# Patient Record
Sex: Male | Born: 1940 | Race: White | Hispanic: No | Smoking: Current every day smoker
Health system: Southern US, Community
[De-identification: ages and names within clinical notes are randomized; demographics above are authoritative.]

## PROBLEM LIST (undated history)

## (undated) DIAGNOSIS — IMO0001 Reserved for inherently not codable concepts without codable children: Secondary | ICD-10-CM

## (undated) DIAGNOSIS — F17200 Nicotine dependence, unspecified, uncomplicated: Secondary | ICD-10-CM

## (undated) DIAGNOSIS — Z905 Acquired absence of kidney: Secondary | ICD-10-CM

## (undated) DIAGNOSIS — C689 Malignant neoplasm of urinary organ, unspecified: Secondary | ICD-10-CM

## (undated) DIAGNOSIS — Z906 Acquired absence of other parts of urinary tract: Secondary | ICD-10-CM

## (undated) DIAGNOSIS — D649 Anemia, unspecified: Secondary | ICD-10-CM

## (undated) DIAGNOSIS — C649 Malignant neoplasm of unspecified kidney, except renal pelvis: Secondary | ICD-10-CM

## (undated) DIAGNOSIS — F419 Anxiety disorder, unspecified: Secondary | ICD-10-CM

## (undated) DIAGNOSIS — F329 Major depressive disorder, single episode, unspecified: Secondary | ICD-10-CM

## (undated) DIAGNOSIS — K469 Unspecified abdominal hernia without obstruction or gangrene: Secondary | ICD-10-CM

## (undated) DIAGNOSIS — F32A Depression, unspecified: Secondary | ICD-10-CM

## (undated) DIAGNOSIS — C679 Malignant neoplasm of bladder, unspecified: Secondary | ICD-10-CM

## (undated) DIAGNOSIS — T7840XA Allergy, unspecified, initial encounter: Secondary | ICD-10-CM

## (undated) DIAGNOSIS — I10 Essential (primary) hypertension: Secondary | ICD-10-CM

## (undated) DIAGNOSIS — R030 Elevated blood-pressure reading, without diagnosis of hypertension: Secondary | ICD-10-CM

## (undated) DIAGNOSIS — Z1211 Encounter for screening for malignant neoplasm of colon: Secondary | ICD-10-CM

## (undated) DIAGNOSIS — Z8551 Personal history of malignant neoplasm of bladder: Secondary | ICD-10-CM

## (undated) HISTORY — DX: Elevated blood-pressure reading, without diagnosis of hypertension: R03.0

## (undated) HISTORY — DX: Depression, unspecified: F32.A

## (undated) HISTORY — PX: HERNIA REPAIR: SHX51

## (undated) HISTORY — DX: Malignant neoplasm of bladder, unspecified: C67.9

## (undated) HISTORY — DX: Acquired absence of other parts of urinary tract: Z90.6

## (undated) HISTORY — DX: Essential (primary) hypertension: I10

## (undated) HISTORY — DX: Nicotine dependence, unspecified, uncomplicated: F17.200

## (undated) HISTORY — DX: Allergy, unspecified, initial encounter: T78.40XA

## (undated) HISTORY — PX: STOMACH SURGERY: SHX791

## (undated) HISTORY — PX: BLADDER SURGERY: SHX569

## (undated) HISTORY — DX: Malignant neoplasm of urinary organ, unspecified: C68.9

## (undated) HISTORY — PX: LITHOTRIPSY: SUR834

## (undated) HISTORY — DX: Acquired absence of kidney: Z90.5

## (undated) HISTORY — DX: Anxiety disorder, unspecified: F41.9

## (undated) HISTORY — DX: Reserved for inherently not codable concepts without codable children: IMO0001

## (undated) HISTORY — DX: Major depressive disorder, single episode, unspecified: F32.9

## (undated) HISTORY — DX: Malignant neoplasm of unspecified kidney, except renal pelvis: C64.9

## (undated) HISTORY — DX: Anemia, unspecified: D64.9

## (undated) HISTORY — DX: Unspecified abdominal hernia without obstruction or gangrene: K46.9

---

## 2003-06-05 ENCOUNTER — Ambulatory Visit (HOSPITAL_COMMUNITY): Admission: RE | Admit: 2003-06-05 | Discharge: 2003-06-05 | Payer: Self-pay | Admitting: Urology

## 2006-03-09 ENCOUNTER — Ambulatory Visit (HOSPITAL_COMMUNITY): Admission: RE | Admit: 2006-03-09 | Discharge: 2006-03-09 | Payer: Self-pay | Admitting: *Deleted

## 2006-03-09 ENCOUNTER — Encounter (INDEPENDENT_AMBULATORY_CARE_PROVIDER_SITE_OTHER): Payer: Self-pay | Admitting: Specialist

## 2006-03-09 LAB — HM COLONOSCOPY

## 2009-06-20 HISTORY — PX: OTHER SURGICAL HISTORY: SHX169

## 2009-11-11 ENCOUNTER — Encounter (INDEPENDENT_AMBULATORY_CARE_PROVIDER_SITE_OTHER): Payer: Self-pay | Admitting: Urology

## 2009-11-11 ENCOUNTER — Ambulatory Visit (HOSPITAL_COMMUNITY): Admission: RE | Admit: 2009-11-11 | Discharge: 2009-11-12 | Payer: Self-pay | Admitting: Urology

## 2010-01-06 ENCOUNTER — Encounter: Payer: Self-pay | Admitting: Urology

## 2010-01-06 ENCOUNTER — Inpatient Hospital Stay (HOSPITAL_COMMUNITY)
Admission: RE | Admit: 2010-01-06 | Discharge: 2010-01-08 | Payer: Self-pay | Source: Home / Self Care | Admitting: Urology

## 2010-03-30 ENCOUNTER — Ambulatory Visit (HOSPITAL_COMMUNITY)
Admission: RE | Admit: 2010-03-30 | Discharge: 2010-03-30 | Payer: Self-pay | Source: Home / Self Care | Admitting: Urology

## 2010-07-01 ENCOUNTER — Observation Stay (HOSPITAL_COMMUNITY)
Admission: EM | Admit: 2010-07-01 | Discharge: 2010-07-02 | Payer: Self-pay | Source: Home / Self Care | Attending: Internal Medicine | Admitting: Internal Medicine

## 2010-07-05 LAB — LIPID PANEL
Cholesterol: 140 mg/dL (ref 0–200)
HDL: 30 mg/dL — ABNORMAL LOW (ref >39)
LDL Cholesterol: 96 mg/dL (ref 0–99)
Total CHOL/HDL Ratio: 4.7 RATIO
Triglycerides: 71 mg/dL (ref <150)
VLDL: 14 mg/dL (ref 0–40)

## 2010-07-05 LAB — POCT I-STAT, CHEM 8
BUN: 22 mg/dL (ref 6–23)
Calcium, Ion: 1.15 mmol/L (ref 1.12–1.32)
Chloride: 109 mEq/L (ref 96–112)
Creatinine, Ser: 1.8 mg/dL — ABNORMAL HIGH (ref 0.4–1.5)
Glucose, Bld: 89 mg/dL (ref 70–99)
HCT: 43 % (ref 39.0–52.0)
Hemoglobin: 14.6 g/dL (ref 13.0–17.0)
Potassium: 3.9 mEq/L (ref 3.5–5.1)
Sodium: 141 mEq/L (ref 135–145)
TCO2: 23 mmol/L (ref 0–100)

## 2010-07-05 LAB — COMPREHENSIVE METABOLIC PANEL
ALT: 11 U/L (ref 0–53)
ALT: 10 U/L (ref 0–53)
AST: 18 U/L (ref 0–37)
AST: 16 U/L (ref 0–37)
Albumin: 3.9 g/dL (ref 3.5–5.2)
Albumin: 3.1 g/dL — ABNORMAL LOW (ref 3.5–5.2)
Alkaline Phosphatase: 71 U/L (ref 39–117)
Alkaline Phosphatase: 58 U/L (ref 39–117)
BUN: 19 mg/dL (ref 6–23)
BUN: 19 mg/dL (ref 6–23)
CO2: 24 mEq/L (ref 19–32)
CO2: 24 mEq/L (ref 19–32)
Calcium: 9.4 mg/dL (ref 8.4–10.5)
Calcium: 8.6 mg/dL (ref 8.4–10.5)
Chloride: 108 mEq/L (ref 96–112)
Chloride: 110 mEq/L (ref 96–112)
Creatinine, Ser: 1.69 mg/dL — ABNORMAL HIGH (ref 0.4–1.5)
Creatinine, Ser: 1.57 mg/dL — ABNORMAL HIGH (ref 0.4–1.5)
GFR calc Af Amer: 49 mL/min — ABNORMAL LOW (ref >60)
GFR calc Af Amer: 53 mL/min — ABNORMAL LOW (ref >60)
GFR calc non Af Amer: 40 mL/min — ABNORMAL LOW (ref >60)
GFR calc non Af Amer: 44 mL/min — ABNORMAL LOW (ref >60)
Glucose, Bld: 93 mg/dL (ref 70–99)
Glucose, Bld: 149 mg/dL — ABNORMAL HIGH (ref 70–99)
Potassium: 4 mEq/L (ref 3.5–5.1)
Potassium: 3.8 mEq/L (ref 3.5–5.1)
Sodium: 140 mEq/L (ref 135–145)
Sodium: 139 mEq/L (ref 135–145)
Total Bilirubin: 0.7 mg/dL (ref 0.3–1.2)
Total Bilirubin: 0.7 mg/dL (ref 0.3–1.2)
Total Protein: 7.2 g/dL (ref 6.0–8.3)
Total Protein: 5.6 g/dL — ABNORMAL LOW (ref 6.0–8.3)

## 2010-07-05 LAB — DIFFERENTIAL
Basophils Absolute: 0 10*3/uL (ref 0.0–0.1)
Basophils Relative: 1 % (ref 0–1)
Eosinophils Absolute: 0.3 10*3/uL (ref 0.0–0.7)
Eosinophils Relative: 4 % (ref 0–5)
Lymphocytes Relative: 47 % — ABNORMAL HIGH (ref 12–46)
Lymphs Abs: 3 10*3/uL (ref 0.7–4.0)
Monocytes Absolute: 0.6 10*3/uL (ref 0.1–1.0)
Monocytes Relative: 9 % (ref 3–12)
Neutro Abs: 2.5 10*3/uL (ref 1.7–7.7)
Neutrophils Relative %: 40 % — ABNORMAL LOW (ref 43–77)

## 2010-07-05 LAB — CBC
HCT: 41.5 % (ref 39.0–52.0)
HCT: 35.4 % — ABNORMAL LOW (ref 39.0–52.0)
Hemoglobin: 14 g/dL (ref 13.0–17.0)
Hemoglobin: 12.1 g/dL — ABNORMAL LOW (ref 13.0–17.0)
MCH: 31.6 pg (ref 26.0–34.0)
MCH: 32.2 pg (ref 26.0–34.0)
MCHC: 33.7 g/dL (ref 30.0–36.0)
MCHC: 34.2 g/dL (ref 30.0–36.0)
MCV: 93.7 fL (ref 78.0–100.0)
MCV: 94.1 fL (ref 78.0–100.0)
Platelets: 162 10*3/uL (ref 150–400)
Platelets: 123 10*3/uL — ABNORMAL LOW (ref 150–400)
RBC: 4.43 MIL/uL (ref 4.22–5.81)
RBC: 3.76 MIL/uL — ABNORMAL LOW (ref 4.22–5.81)
RDW: 12.6 % (ref 11.5–15.5)
RDW: 12.7 % (ref 11.5–15.5)
WBC: 6.4 10*3/uL (ref 4.0–10.5)
WBC: 5.9 10*3/uL (ref 4.0–10.5)

## 2010-07-05 LAB — CARDIAC PANEL(CRET KIN+CKTOT+MB+TROPI)
CK, MB: 0.9 ng/mL (ref 0.3–4.0)
CK, MB: 0.8 ng/mL (ref 0.3–4.0)
Relative Index: INVALID (ref 0.0–2.5)
Relative Index: INVALID (ref 0.0–2.5)
Total CK: 20 U/L (ref 7–232)
Total CK: 24 U/L (ref 7–232)
Troponin I: 0.02 ng/mL (ref 0.00–0.06)
Troponin I: 0.02 ng/mL (ref 0.00–0.06)

## 2010-07-05 LAB — POCT CARDIAC MARKERS
CKMB, poc: <1 ng/mL — ABNORMAL LOW (ref 1.0–8.0)
CKMB, poc: <1 ng/mL — ABNORMAL LOW (ref 1.0–8.0)
CKMB, poc: <1 ng/mL — ABNORMAL LOW (ref 1.0–8.0)
Myoglobin, poc: 64.8 ng/mL (ref 12–200)
Myoglobin, poc: 60.5 ng/mL (ref 12–200)
Myoglobin, poc: 66.5 ng/mL (ref 12–200)
Troponin i, poc: <0.05 ng/mL (ref 0.00–0.09)
Troponin i, poc: <0.05 ng/mL (ref 0.00–0.09)
Troponin i, poc: <0.05 ng/mL (ref 0.00–0.09)

## 2010-07-05 LAB — BRAIN NATRIURETIC PEPTIDE
Pro B Natriuretic peptide (BNP): 58 pg/mL (ref 0.0–100.0)
Pro B Natriuretic peptide (BNP): 67 pg/mL (ref 0.0–100.0)

## 2010-07-05 LAB — TSH: TSH: 1.7 u[IU]/mL (ref 0.350–4.500)

## 2010-07-05 LAB — HEMOGLOBIN A1C
Hgb A1c MFr Bld: 5.6 % (ref <5.7)
Mean Plasma Glucose: 114 mg/dL (ref <117)

## 2010-07-05 LAB — MAGNESIUM: Magnesium: 2.1 mg/dL (ref 1.5–2.5)

## 2010-07-05 LAB — D-DIMER, QUANTITATIVE (NOT AT ARMC): D-Dimer, Quant: 0.55 ug/mL-FEU — ABNORMAL HIGH (ref 0.00–0.48)

## 2010-07-05 LAB — PHOSPHORUS: Phosphorus: 2.9 mg/dL (ref 2.3–4.6)

## 2010-07-09 NOTE — Consult Note (Signed)
NAMETESHAWN, MOAN                 ACCOUNT NO.:  [id number]  MEDICAL RECORD NO.:  [id number]          PATIENT TYPE:  INP  LOCATION:  0104                         FACILITY:  [facility]  PHYSICIAN:  Jesse Sans. Wall, MD, FACCDATE OF BIRTH:  [birth year]  DATE OF CONSULTATION: DATE OF DISCHARGE:                                CONSULTATION   REASON FOR CONSULTATION:  We were asked by Dr. Susie Cassette of the [facility] to evaluate Ricardo Livingston with chest discomfort.  HISTORY OF PRESENT ILLNESS:  This is a 70 year old gentleman followed by Dr. Benedetto Goad, primary care, without known coronary disease.  He came into his kitchen the other day with groceries and had a sudden onset of left-sided sharp pain that actually radiated into the left upper arm and also to the right chest.  He has had these in the past intermittently. They are not clearly exertion related.  He denies any true angina or ischemic symptoms.  He is not a very good historian.  He "panicked" and came to the emergency department.  There his EKG showed no acute changes.  His point of cares have been negative x3.  His cardiac enzymes are negative now x2.  Chest pain was relieved on his own.  He did not have taking nitroglycerin.  He denies any fever, chills, cough, shortness of breath, palpitations, syncope or presyncope.  PAST MEDICAL HISTORY:  His past medical history is significant for bladder cancer.  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 He has had a history of nephrolithiasis.  SOCIAL HISTORY:  Lives in [place].  He quit tobacco 10 years ago, but now smokes a pipe on occasion.  FAMILY HISTORY:  Remarkable for his mother dying of breast cancer. Father dying of liver cancer.  There is no early history or premature history of coronary disease.  HOME MEDICATIONS:  None.  ALLERGIES:  No known drug allergies.  INPATIENT MEDICATIONS: 1. Aspirin 81 mg a  day. 2. Lovenox 40 mg subcutaneous. 3. Atrovent 0.5 inhaler q.6. 4. Albuterol inhaler q.6.  REVIEW OF SYSTEMS:  Negative other than the HPI.  All systems questioned.  PHYSICAL EXAMINATION:  He is a very pleasant gentleman in no acute distress.  Blood pressure 135/55, pulse is 54 and sinus brady, temperature is 98.7, saturating 100% on room air, respiratory rate 16. He is well-developed in no acute distress. HEENT is unremarkable..  He has a beard. NECK:  Shows no JVD.  Carotid strokes are equal bilateral with a left carotid bruit.  Thyroid is not enlarged.  Trachea is midline. LUNGS:  Clear to auscultation. CARDIAC:  Exam reveals a nondisplaced PMI.  Normal S1-S2.  No gallop or murmur or rub. ABDOMEN:  Soft, good bowel sounds.  No midline bruit.  No obvious hepatomegaly. EXTREMITIES:  No cyanosis, clubbing or edema.  Pulses are present. NEUROLOGIC:  Intact.  LABORATORY DATA:  Chest x-ray shows stable COPD with no acute changes. CT angiogram of the chest shows no pulmonary embolus.  Echocardiogram is pending.  EKGs have shown sinus bradycardia, first-degree AV block.  No acute  ST-segment changes.  Total cholesterol is 140, triglycerides 71, HDL 30, LDL 96.  BNP was 67. Creatinine is 1.57.  ASSESSMENT/PLAN:  Chest discomfort, which is very difficult to sort out historically.  He does have significant cardiac risk factors including age, tobacco use, and HDL of less than 40.  He also has a left carotid bruit, which may be atherosclerosis.  PLAN: 1. Discharge home on enteric-coated aspirin one p.o. daily. 2. Moderate activity. 3. We will arrange outpatient exercise stress Myoview and carotid     Dopplers with follow up with me in the office. 4. I also will follow up the echo results.  I have reviewed this with the patient.  All questions answered.  Thank for the consultation.     Marshell C. Daleen Squibb, MD, [facility]     TCW/MEDQ  D:  07/02/2010  T:  07/02/2010  Job:   [id number]  Electronically Signed by Valera Castle MD [facility] on 07/09/2010 04:14:55 PM

## 2010-07-13 ENCOUNTER — Encounter: Payer: Self-pay | Admitting: Cardiology

## 2010-07-13 ENCOUNTER — Telehealth (INDEPENDENT_AMBULATORY_CARE_PROVIDER_SITE_OTHER): Payer: Self-pay | Admitting: *Deleted

## 2010-07-13 DIAGNOSIS — R0989 Other specified symptoms and signs involving the circulatory and respiratory systems: Secondary | ICD-10-CM | POA: Insufficient documentation

## 2010-07-14 ENCOUNTER — Encounter: Payer: Self-pay | Admitting: *Deleted

## 2010-07-14 ENCOUNTER — Encounter (HOSPITAL_COMMUNITY)
Admission: RE | Admit: 2010-07-14 | Discharge: 2010-07-20 | Payer: Self-pay | Source: Home / Self Care | Attending: Cardiology | Admitting: Cardiology

## 2010-07-14 ENCOUNTER — Ambulatory Visit: Admission: RE | Admit: 2010-07-14 | Discharge: 2010-07-14 | Payer: Self-pay | Source: Home / Self Care

## 2010-07-14 ENCOUNTER — Encounter: Payer: Self-pay | Admitting: Cardiology

## 2010-07-15 NOTE — Discharge Summary (Addendum)
  Ricardo Livingston, Ricardo Livingston                 ACCOUNT NO.:  [id number]  MEDICAL RECORD NO.:  [id number]          PATIENT TYPE:  INP  LOCATION:  0104                         FACILITY:  [facility]  PHYSICIAN:  Richarda Overlie, MD       DATE OF BIRTH:  Jan 26, 1941  DATE OF ADMISSION:  07/01/2010 DATE OF DISCHARGE:  07/02/2010                        DISCHARGE SUMMARY - REFERRING   PRIMARY CARE PHYSICIAN:  Benedetto Goad, MD  DISCHARGE DIAGNOSES: 1. Chest pain, ruled out for acute coronary syndrome. 2. Chronic kidney disease with a baseline creatinine of 1.5. 3. Nicotine dependence.  PROCEDURES:  CT angio of the chest:  There is no evidence of acute PE, irregular noncalcified plaque identified along the wall of the descending thoracic aorta.  Chest x-ray:  Stable changes of COPD.  LABORATORY DATA:  TSH of 1.7.  Hemoglobin A1c 5.6.  Lipid panel shows an LDL of 96.  BNP of 67.  Phosphorus 2.9, magnesium 2.1.  Point-of-care markers negative.  CONSULTATIONS:  Dr. Valera Castle for chest pain.  SUBJECTIVE:  This is a 70 year old male with a history of nicotine dependence, chronic kidney disease because of a history of bladder cancer and urothelial cancer of the left kidney, status post laparoscopic nephroureterectomy by Dr. Retta Diones. Who presents to the ED with a chief complaint of left-sided chest pain.  The patient was evaluated in the ED.  He had normal vital signs initially but then became hypertensive with systolic blood pressure in 170s during his chest pain episodes.  He did have a mildly elevated D-dimer of 0.55. His workup in the ED was essentially negative.  He had an EKG that showed sinus bradycardia with a rate of 55 beats per minute and a first- degree AV block, with a PR interval of 210.  He did not have any evidence of pulmonary embolism, pneumonia or dissection on the CT chest. Because of his history of smoking, history of chronic kidney disease pain, as well as his advanced age, it was  thought that he would probably benefit from a stress test.  However, the patient was evaluated by Cardiology and deemed to be appropriate to have a stress test in an outpatient setting.  The patient's stress test is being scheduled by [facility], an appointment has been given to the patient.  The patient will follow up with his PCP in 5 to 7 days and follow up with [facility] as instructed by them.  DISCHARGE MEDICATIONS: 1. Aspirin 81 mg p.o. daily. 2. Nicotine patch 14 mg per day for 3 weeks and 7 mg per day for 3     weeks, then discontinue. 3. Nitroglycerin sublingual p.r.n. pain.     Richarda Overlie, MD     NA/MEDQ  D:  07/02/2010  T:  07/02/2010  Job:  [id number]  cc:   Gloriajean Dell. Andrey Campanile, M.D. Fax: [phone]  Electronically Signed by Richarda Overlie MD on 07/15/2010 11:21:11 AM

## 2010-07-22 NOTE — Assessment & Plan Note (Addendum)
Summary: Cardiology Nuclear Testing  Nuclear Med Background Indications for Stress Test: Evaluation for Ischemia, Post Hospital  Indications Comments: 07/02/10 chest pain, negative enzymes  History: COPD   Symptoms: Chest Pain, Chest Pressure, Near Syncope, Palpitations, SOB  Symptoms Comments: CP>upper (L) arm   Nuclear Pre-Procedure Cardiac Risk Factors: History of Smoking Caffeine/Decaff Intake: None NPO After: 8:30 AM Lungs: clear IV 0.9% NS with Angio Cath: 20g     IV Site: R Hand Chest Size (in) 42     Height (in): 71 Weight (lb): 158 BMI: 22.12  Nuclear Med Study 1 or 2 day study:  1 day     Stress Test Type:  Stress Reading MD:  Cassell Clement, MD     Referring MD:  T.Wall Resting Radionuclide:  Technetium 44m Tetrofosmin     Resting Radionuclide Dose:  11 mCi  Stress Radionuclide:  Technetium 34m Tetrofosmin     Stress Radionuclide Dose:  33 mCi   Stress Protocol Exercise Time (min):  5:46 min     Max HR:  129 bpm     Predicted Max HR:  150 bpm  Max Systolic BP: 217 mm Hg     Percent Max HR:  86 %     METS: 6.2 Rate Pressure Product:  82956    Stress Test Technologist:  Milana Na, EMT-P     Nuclear Technologist:  Doyne Keel, CNMT  Rest Procedure  Myocardial perfusion imaging was performed at rest 45 minutes following the intravenous administration of Technetium 16m Tetrofosmin.  Stress Procedure  The patient exercised for 5:46. The patient stopped due to fatigue, sob,and denied any chest pain.  There were non specific ST-T wave changes,pvcs,pat,and v:cuplets.  Technetium 60m Tetrofosmin was injected at peak exercise and myocardial perfusion imaging was performed after a brief delay.  QPS Raw Data Images:  Normal; no motion artifact; normal heart/lung ratio. Stress Images:  Normal homogeneous uptake in all areas of the myocardium. Rest Images:  Normal homogeneous uptake in all areas of the myocardium. Subtraction (SDS):  No evidence of  ischemia. Transient Ischemic Dilatation:  1.11  (Normal <1.22)  Lung/Heart Ratio:  0.29  (Normal <0.45)  Quantitative Gated Spect Images QGS EDV:  129 ml QGS ESV:  57 ml QGS EF:  56 %  Findings Normal nuclear study      Overall Impression  Exercise Capacity: Good exercise capacity. BP Response: Normal blood pressure response. Clinical Symptoms: No chest pain ECG Impression: No significant ST segment change suggestive of ischemia. Overall Impression: Normal stress nuclear study.  Appended Document: Cardiology Nuclear Testing reassurance, noncardiac CP. No followup needed.  Appended Document: Cardiology Nuclear Testing Pt aware of test results. He will follow-up as needed.   Mylo Red RN

## 2010-07-22 NOTE — Progress Notes (Signed)
Summary: Nuclear pre procedure  Phone Note Call from Patient   Caller: Patient Call For: Myoview instructions Summary of Call: Reviewed information on Myoview Information Sheet (see scanned document for further details).  Spoke with patient.      Nuclear Med Background Indications for Stress Test: Evaluation for Ischemia, Post Hospital  Indications Comments: 07/02/10 chest pain, negative enzymes  History: COPD   Symptoms: Chest Pain, Chest Pressure, Near Syncope, SOB  Symptoms Comments: CP>upper (L) arm   Nuclear Pre-Procedure Cardiac Risk Factors: History of Smoking Tech Comments: Smokes pipe

## 2010-07-22 NOTE — Miscellaneous (Signed)
Summary: Orders Update  Clinical Lists Changes  Problems: Added new problem of CAROTID BRUIT (ICD-785.9) Orders: Added new Test order of Carotid Duplex (Carotid Duplex) - Signed 

## 2010-07-28 ENCOUNTER — Encounter: Payer: Self-pay | Admitting: Cardiology

## 2010-09-04 LAB — TYPE AND SCREEN
ABO/RH(D): A POS
Antibody Screen: NEGATIVE

## 2010-09-04 LAB — BASIC METABOLIC PANEL
BUN: 16 mg/dL (ref 6–23)
CO2: 25 mEq/L (ref 19–32)
CO2: 28 mEq/L (ref 19–32)
Calcium: 8.6 mg/dL (ref 8.4–10.5)
Calcium: 8.7 mg/dL (ref 8.4–10.5)
Chloride: 108 mEq/L (ref 96–112)
Creatinine, Ser: 1.66 mg/dL — ABNORMAL HIGH (ref 0.4–1.5)
Creatinine, Ser: 1.7 mg/dL — ABNORMAL HIGH (ref 0.4–1.5)
GFR calc non Af Amer: 41 mL/min — ABNORMAL LOW (ref >60)
Glucose, Bld: 127 mg/dL — ABNORMAL HIGH (ref 70–99)
Glucose, Bld: 170 mg/dL — ABNORMAL HIGH (ref 70–99)

## 2010-09-05 LAB — CBC
HCT: 40.8 % (ref 39.0–52.0)
Hemoglobin: 13.9 g/dL (ref 13.0–17.0)
MCV: 93 fL (ref 78.0–100.0)
RBC: 4.39 MIL/uL (ref 4.22–5.81)
WBC: 6.5 10*3/uL (ref 4.0–10.5)

## 2010-09-05 LAB — SURGICAL PCR SCREEN: MRSA, PCR: NEGATIVE

## 2010-09-06 LAB — BASIC METABOLIC PANEL
BUN: 14 mg/dL (ref 6–23)
BUN: 15 mg/dL (ref 6–23)
Chloride: 106 mEq/L (ref 96–112)
Chloride: 109 mEq/L (ref 96–112)
Creatinine, Ser: 1.44 mg/dL (ref 0.4–1.5)
Glucose, Bld: 122 mg/dL — ABNORMAL HIGH (ref 70–99)
Glucose, Bld: 96 mg/dL (ref 70–99)
Potassium: 4.3 mEq/L (ref 3.5–5.1)
Potassium: 4.7 mEq/L (ref 3.5–5.1)
Sodium: 144 mEq/L (ref 135–145)

## 2010-09-06 LAB — CBC
HCT: 37.4 % — ABNORMAL LOW (ref 39.0–52.0)
HCT: 43.6 % (ref 39.0–52.0)
Hemoglobin: 14.3 g/dL (ref 13.0–17.0)
MCV: 95.2 fL (ref 78.0–100.0)
MCV: 96.1 fL (ref 78.0–100.0)
Platelets: 123 10*3/uL — ABNORMAL LOW (ref 150–400)
Platelets: 98 10*3/uL — ABNORMAL LOW (ref 150–400)
RDW: 13.1 % (ref 11.5–15.5)
RDW: 13.4 % (ref 11.5–15.5)
WBC: 6.3 10*3/uL (ref 4.0–10.5)
WBC: 9.6 10*3/uL (ref 4.0–10.5)

## 2010-11-05 NOTE — Op Note (Signed)
[name], Ricardo Livingston                 ACCOUNT NO.:  [id number]   MEDICAL RECORD NO.:  [id number]          PATIENT TYPE:  AMB   LOCATION:  ENDO                         FACILITY:  [facility]   PHYSICIAN:  Georgiana Spinner, M.D.    DATE OF BIRTH:  1940/07/23   DATE OF PROCEDURE:  03/09/2006  DATE OF DISCHARGE:                                 OPERATIVE REPORT   PROCEDURE:  Upper endoscopy.   INDICATIONS:  Dysphagia.   ANESTHESIA:  Demerol 40 mg and Versed 7.5 mg.   PROCEDURE:  With the patient mildly sedated in the left lateral decubitus  position, the Olympus videoscopic endoscope was inserted in the mouth,  passed under direct vision through the esophagus which appeared normal but  felt tight passing through the distal esophagus.  We felt like we popped  into the stomach, no abnormalities of the squamocolumnar junction could be  discerned.  We advanced into the stomach fundus, body, antrum, duodenal  bulb, second portion of duodenum were visualized.  From this point, the  endoscope was slowly withdrawn taking circumferential views of duodenal  mucosa until the endoscope had been pulled back into the stomach, placed in  retroflexion to view the stomach from below.  The endoscope was then  straightened and withdrawn taking circumferential views of the remaining  gastric and esophageal mucosa then reinserted back to the distal stomach  where a guidewire was passed.  Subsequently, the endoscope was withdrawn and  16 Savary dilator was passed rather easily over this guidewire and then  withdrawn along with a guidewire.  There was no blood on the dilator.  The  patient's vital signs and pulse oximeter remained stable.  The patient  tolerated procedure well without apparent complications.   FINDINGS:  Tight lower esophageal sphincter area dilated to 16 Savary.   PLAN:  Await clinical response and have patient follow-up with me as an  outpatient.  Proceed to colonoscopy     ______________________________  Georgiana Spinner, M.D.     GMO/MEDQ  D:  03/09/2006  T:  03/10/2006  Job:  [id number]

## 2011-02-02 ENCOUNTER — Telehealth: Payer: Self-pay | Admitting: Cardiology

## 2011-02-02 LAB — LIPID PANEL
Cholesterol: 151 mg/dL (ref 0–200)
HDL: 38 mg/dL (ref 35–70)
Triglycerides: 110 mg/dL (ref 40–160)

## 2011-02-02 LAB — TSH: TSH: 1.9 u[IU]/mL (ref <=5.90)

## 2011-02-02 NOTE — Telephone Encounter (Signed)
Received ROI, via scheduler's fax, requesting Stress results. Faxed results to Dr. Earl Lites at [facility] ([phone]).

## 2011-03-22 ENCOUNTER — Encounter (INDEPENDENT_AMBULATORY_CARE_PROVIDER_SITE_OTHER): Payer: Self-pay | Admitting: Surgery

## 2011-03-23 ENCOUNTER — Encounter (INDEPENDENT_AMBULATORY_CARE_PROVIDER_SITE_OTHER): Payer: Self-pay | Admitting: Surgery

## 2011-03-24 ENCOUNTER — Encounter (INDEPENDENT_AMBULATORY_CARE_PROVIDER_SITE_OTHER): Payer: Self-pay | Admitting: Surgery

## 2011-03-24 ENCOUNTER — Ambulatory Visit (INDEPENDENT_AMBULATORY_CARE_PROVIDER_SITE_OTHER): Payer: Medicare Other | Admitting: Surgery

## 2011-03-24 DIAGNOSIS — K4090 Unilateral inguinal hernia, without obstruction or gangrene, not specified as recurrent: Secondary | ICD-10-CM

## 2011-03-24 NOTE — Progress Notes (Signed)
Chief Complaint  Patient presents with  . Groin Swelling    left inguinal hernia - referral from Dr. Earl Lites, [facility]    HISTORY: Patient is a 70 year old male referred by his primary physician for her newly diagnosed left inguinal hernia. Patient first noted a bulge in July 2012 after lifting. He has had some minor discomfort. It is always been in the left groin. It has gradually increased in size over the past few months. He denies any signs or symptoms of obstruction. He has had no prior hernia repairs. He has had prior abdominal surgery for a renal cell carcinoma and a bladder tumor.   Past Medical History  Diagnosis Date  . Bladder cancer   . Urothelial cancer   . History of nephroureterectomy     left kidney  . Depression   . Hernia     left   . Nicotine addiction     pipe  . Hypertension      Current Outpatient Prescriptions  Medication Sig Dispense Refill  . aspirin 81 MG tablet Take 81 mg by mouth daily.           No Known Allergies   Family History  Problem Relation Age of Onset  . Breast cancer Mother   . Cancer Mother 48    breast  . Liver cancer Father   . Cancer Father 48    colon     History   Social History  . Marital Status: Widowed    Spouse Name: [name]    Number of Children: [name]  . Years of Education: [name]   Social History Main Topics  . Smoking status: Former Smoker    Quit date: 03/24/1999  . Smokeless tobacco: Never Used  . Alcohol Use: Yes     2-3 drinks in evening  . Drug Use: No  . Sexually Active: None   Other Topics Concern  . None   Social History Narrative  . None     REVIEW OF SYSTEMS - PERTINENT POSITIVES ONLY: Denies signs or symptoms of obstruction. Minor discomfort left groin.   EXAM: Filed Vitals:   03/24/11 1335  BP: 146/84  Pulse: 62  Temp: 97.9 F (36.6 C)  Resp: 16    HEENT: normocephalic; pupils equal and reactive; sclerae clear; dentition good; mucous membranes  moist NECK:  No palpable nodules; symmetric on extension; no palpable anterior or posterior cervical lymphadenopathy; no supraclavicular masses; no tenderness CHEST: clear to auscultation bilaterally without rales, rhonchi, or wheezes CARDIAC: regular rate and rhythm without significant murmur; peripheral pulses are full GU:  Penis and testicles are normal without lesion. Palpation in the right inguinal canal with cough and Valsalva and shows no sign of hernia. Palpation in the left inguinal canal shows an obvious bulge. This is reducible. It augments with cough and Valsalva. It is consistent with left inguinal hernia. EXT:  non-tender without edema; no deformity NEURO: no gross focal deficits; no sign of tremor   LABORATORY RESULTS: See E-Chart for most recent results   RADIOLOGY RESULTS: See E-Chart or I-Site for most recent results   IMPRESSION: #1 left inguinal hernia, reducible, mildly symptomatic #2 history of renal cell carcinoma #3 history of bladder tumor   PLAN: The patient and I discussed the above issues at length. I provided him with written literature about hernia repair. We discussed repair of the left inguinal hernia with mesh. We discussed the risk of recurrence being approximately 2%. We discussed restrictions on  his activity following the procedure. He understands and wishes to proceed. We will make arrangements for surgery at a time convenient for the patient in the near future.  The risks and benefits of the procedure have been discussed at length with the patient.  The patient understands the proposed procedure, potential alternative treatments, and the course of recovery to be expected.  All of the patient's questions have been answered at this time.  The patient wishes to proceed with surgery and will schedule a date for their procedure through our office staff.   Velora Heckler, MD, FACS General & Endocrine Surgery [facility]      Visit Diagnoses: 1. Inguinal hernia unilateral, non-recurrent     Primary Care Physician: Lucilla Edin, MD

## 2011-04-21 ENCOUNTER — Encounter (HOSPITAL_COMMUNITY): Payer: Medicare Other

## 2011-04-21 ENCOUNTER — Encounter (HOSPITAL_COMMUNITY): Payer: Self-pay

## 2011-04-21 LAB — DIFFERENTIAL
Basophils Absolute: 0 10*3/uL (ref 0.0–0.1)
Basophils Relative: 1 % (ref 0–1)
Eosinophils Absolute: 0.4 10*3/uL (ref 0.0–0.7)
Eosinophils Relative: 4 % (ref 0–5)
Lymphs Abs: 3.2 10*3/uL (ref 0.7–4.0)
Neutrophils Relative %: 49 % (ref 43–77)

## 2011-04-21 LAB — URINALYSIS, ROUTINE W REFLEX MICROSCOPIC
Bilirubin Urine: NEGATIVE
Leukocytes, UA: NEGATIVE
Nitrite: NEGATIVE
Specific Gravity, Urine: 1.013 (ref 1.005–1.030)
Urobilinogen, UA: 0.2 mg/dL (ref 0.0–1.0)
pH: 6.5 (ref 5.0–8.0)

## 2011-04-21 LAB — BASIC METABOLIC PANEL
CO2: 28 mEq/L (ref 19–32)
Calcium: 10.6 mg/dL — ABNORMAL HIGH (ref 8.4–10.5)
Creatinine, Ser: 2.34 mg/dL — ABNORMAL HIGH (ref 0.50–1.35)
GFR calc non Af Amer: 26 mL/min — ABNORMAL LOW (ref >90)
Sodium: 140 mEq/L (ref 135–145)

## 2011-04-21 LAB — CBC
Platelets: 145 10*3/uL — ABNORMAL LOW (ref 150–400)
RBC: 4.49 MIL/uL (ref 4.22–5.81)
RDW: 12.3 % (ref 11.5–15.5)
WBC: 8.3 10*3/uL (ref 4.0–10.5)

## 2011-04-21 LAB — SURGICAL PCR SCREEN: MRSA, PCR: NEGATIVE

## 2011-04-21 LAB — PROTIME-INR
INR: 0.97 (ref 0.00–1.49)
Prothrombin Time: 13.1 seconds (ref 11.6–15.2)

## 2011-04-21 NOTE — Patient Instructions (Signed)
20 Ricardo Livingston  04/21/2011   Your procedure is scheduled on: 04/29/11  Report to [facility]  AT 8:00A  Call this number if you have problems the morning of surgery: [phone]   Remember:   Do not eat food:After Midnight.  Do not drink clear liquids: After Midnight.  Take these medicines the morning of surgery with A SIP OF WATER: NONE   Do not wear jewelry, make-up or nail polish.  Do not wear lotions, powders, or perfumes. You may wear deodorant.  Do not shave 48 hours prior to surgery.  Do not bring valuables to the hospital.  Contacts, dentures or bridgework may not be worn into surgery.  Leave suitcase in the car. After surgery it may be brought to your room.  For patients admitted to the hospital, checkout time is 11:00 AM the day of discharge.   Patients discharged the day of surgery will not be allowed to drive home.  Name and phone number of your driver:   Special Instructions: CHG Shower Use Special Wash: 1/2 bottle night before surgery and 1/2 bottle morning of surgery.   Please read over the following fact sheets that you were given: Surgical Site Infection Prevention

## 2011-04-29 ENCOUNTER — Ambulatory Visit (HOSPITAL_COMMUNITY): Payer: Medicare Other | Admitting: Anesthesiology

## 2011-04-29 ENCOUNTER — Encounter (HOSPITAL_COMMUNITY): Payer: Self-pay | Admitting: Anesthesiology

## 2011-04-29 ENCOUNTER — Encounter (HOSPITAL_COMMUNITY): Payer: Self-pay | Admitting: Surgery

## 2011-04-29 ENCOUNTER — Encounter (HOSPITAL_COMMUNITY)
Admission: RE | Disposition: A | Discharge status: Home / Self Care | Payer: Self-pay | Source: Ambulatory Visit | Attending: Surgery

## 2011-04-29 ENCOUNTER — Ambulatory Visit (HOSPITAL_COMMUNITY)
Admission: RE | Admit: 2011-04-29 | Discharge: 2011-04-29 | Disposition: A | Discharge status: Home / Self Care | Payer: Medicare Other | Source: Ambulatory Visit | Attending: Surgery | Admitting: Surgery

## 2011-04-29 DIAGNOSIS — K4090 Unilateral inguinal hernia, without obstruction or gangrene, not specified as recurrent: Secondary | ICD-10-CM | POA: Insufficient documentation

## 2011-04-29 DIAGNOSIS — Z85528 Personal history of other malignant neoplasm of kidney: Secondary | ICD-10-CM | POA: Insufficient documentation

## 2011-04-29 DIAGNOSIS — I10 Essential (primary) hypertension: Secondary | ICD-10-CM | POA: Insufficient documentation

## 2011-04-29 DIAGNOSIS — Z01812 Encounter for preprocedural laboratory examination: Secondary | ICD-10-CM | POA: Insufficient documentation

## 2011-04-29 HISTORY — PX: INGUINAL HERNIA REPAIR: SHX194

## 2011-04-29 SURGERY — REPAIR, HERNIA, INGUINAL, ADULT
Anesthesia: General | Site: Groin | Laterality: Left | Wound class: Clean

## 2011-04-29 MED ORDER — CEFAZOLIN SODIUM 1-5 GM-% IV SOLN: 1.0000 g | INTRAVENOUS | Status: DC

## 2011-04-29 MED ORDER — BUPIVACAINE LIPOSOME 1.3 % IJ SUSP
INTRAMUSCULAR | Status: DC | PRN
  Administered 2011-04-29: 20 mL

## 2011-04-29 MED ORDER — LIDOCAINE HCL (CARDIAC) 20 MG/ML IV SOLN
INTRAVENOUS | Status: DC | PRN
  Administered 2011-04-29: 100 mg via INTRAVENOUS

## 2011-04-29 MED ORDER — FENTANYL CITRATE 0.05 MG/ML IJ SOLN
INTRAMUSCULAR | Status: DC | PRN
  Administered 2011-04-29: 25 ug via INTRAVENOUS
  Administered 2011-04-29: 50 ug via INTRAVENOUS

## 2011-04-29 MED ORDER — NEOSTIGMINE METHYLSULFATE 1 MG/ML IJ SOLN
INTRAMUSCULAR | Status: DC | PRN
  Administered 2011-04-29: 2.5 mg via INTRAVENOUS

## 2011-04-29 MED ORDER — PROPOFOL 10 MG/ML IV EMUL
INTRAVENOUS | Status: DC | PRN
  Administered 2011-04-29: 150 mg via INTRAVENOUS

## 2011-04-29 MED ORDER — HYDROCODONE-ACETAMINOPHEN 5-325 MG PO TABS
ORAL_TABLET | ORAL | Status: AC
  Administered 2011-04-29: 1 via ORAL
  Filled 2011-04-29: qty 1

## 2011-04-29 MED ORDER — SODIUM CHLORIDE 0.9 % IR SOLN
Status: DC | PRN
  Administered 2011-04-29: 1000 mL

## 2011-04-29 MED ORDER — GLYCOPYRROLATE 0.2 MG/ML IJ SOLN
INTRAMUSCULAR | Status: DC | PRN
  Administered 2011-04-29: .3 mg via INTRAVENOUS

## 2011-04-29 MED ORDER — LACTATED RINGERS IV SOLN
INTRAVENOUS | Status: DC | PRN
  Administered 2011-04-29: 09:00:00 via INTRAVENOUS

## 2011-04-29 MED ORDER — CEFAZOLIN SODIUM 1-5 GM-% IV SOLN
INTRAVENOUS | Status: AC
  Filled 2011-04-29: qty 50

## 2011-04-29 MED ORDER — HYDROMORPHONE HCL PF 1 MG/ML IJ SOLN
INTRAMUSCULAR | Status: AC
  Filled 2011-04-29: qty 1

## 2011-04-29 MED ORDER — ROCURONIUM BROMIDE 100 MG/10ML IV SOLN
INTRAVENOUS | Status: DC | PRN
  Administered 2011-04-29: 20 mg via INTRAVENOUS

## 2011-04-29 MED ORDER — BUPIVACAINE LIPOSOME 1.3 % IJ SUSP
20.0000 mL | Freq: Once | INTRAMUSCULAR | Status: DC
Start: 2011-04-29 — End: 2011-04-29
  Filled 2011-04-29: qty 20

## 2011-04-29 MED ORDER — HYDROCODONE-ACETAMINOPHEN 5-325 MG PO TABS: 1.0000 | ORAL_TABLET | ORAL | Status: AC | PRN

## 2011-04-29 MED ORDER — ONDANSETRON HCL 4 MG/2ML IJ SOLN
INTRAMUSCULAR | Status: DC | PRN
  Administered 2011-04-29: 4 mg via INTRAVENOUS

## 2011-04-29 MED ORDER — HYDROMORPHONE HCL PF 1 MG/ML IJ SOLN
0.2500 mg | INTRAMUSCULAR | Status: DC | PRN
  Administered 2011-04-29 (×2): 0.5 mg via INTRAVENOUS

## 2011-04-29 MED ORDER — PROMETHAZINE HCL 25 MG/ML IJ SOLN: 6.2500 mg | INTRAMUSCULAR | Status: DC | PRN

## 2011-04-29 SURGICAL SUPPLY — 39 items
APL SKNCLS STERI-STRIP NONHPOA (GAUZE/BANDAGES/DRESSINGS) ×1
APPLICATOR COTTON TIP 6IN STRL (MISCELLANEOUS) ×1 IMPLANT
BENZOIN TINCTURE PRP APPL 2/3 (GAUZE/BANDAGES/DRESSINGS) ×2 IMPLANT
BLADE HEX COATED 2.75 (ELECTRODE) ×2 IMPLANT
BLADE SURG 15 STRL LF DISP TIS (BLADE) ×1 IMPLANT
BLADE SURG 15 STRL SS (BLADE) ×2
BLADE SURG SZ10 CARB STEEL (BLADE) IMPLANT
CANISTER SUCTION 2500CC (MISCELLANEOUS) ×2 IMPLANT
CLOSURE STERI STRIP 1/2 X4 (GAUZE/BANDAGES/DRESSINGS) ×1 IMPLANT
CLOTH BEACON ORANGE TIMEOUT ST (SAFETY) ×2 IMPLANT
DECANTER SPIKE VIAL GLASS SM (MISCELLANEOUS) ×1 IMPLANT
DRAIN PENROSE 18X1/2 LTX STRL (DRAIN) ×2 IMPLANT
DRAPE LAPAROTOMY TRNSV 102X78 (DRAPE) ×2 IMPLANT
ELECT REM PT RETURN 9FT ADLT (ELECTROSURGICAL) ×2
ELECTRODE REM PT RTRN 9FT ADLT (ELECTROSURGICAL) ×1 IMPLANT
GLOVE BIOGEL PI IND STRL 7.0 (GLOVE) ×1 IMPLANT
GLOVE BIOGEL PI INDICATOR 7.0 (GLOVE) ×1
GLOVE SURG ORTHO 8.0 STRL STRW (GLOVE) ×2 IMPLANT
GOWN STRL NON-REIN LRG LVL3 (GOWN DISPOSABLE) ×2 IMPLANT
GOWN STRL REIN XL XLG (GOWN DISPOSABLE) ×4 IMPLANT
KIT BASIN OR (CUSTOM PROCEDURE TRAY) ×2 IMPLANT
MESH ULTRAPRO 3X6 7.6X15CM (Mesh General) ×1 IMPLANT
NDL HYPO 25X1 1.5 SAFETY (NEEDLE) ×1 IMPLANT
NEEDLE HYPO 25X1 1.5 SAFETY (NEEDLE) ×2 IMPLANT
NS IRRIG 1000ML POUR BTL (IV SOLUTION) ×2 IMPLANT
PACK BASIC VI WITH GOWN DISP (CUSTOM PROCEDURE TRAY) ×2 IMPLANT
PENCIL BUTTON HOLSTER BLD 10FT (ELECTRODE) ×2 IMPLANT
SPONGE GAUZE 4X4 12PLY (GAUZE/BANDAGES/DRESSINGS) ×2 IMPLANT
SPONGE LAP 4X18 X RAY DECT (DISPOSABLE) ×4 IMPLANT
STRIP CLOSURE SKIN 1/2X4 (GAUZE/BANDAGES/DRESSINGS) ×2 IMPLANT
SUT NOVA NAB GS-22 2 0 T19 (SUTURE) ×4 IMPLANT
SUT SILK 2 0 SH (SUTURE) ×2 IMPLANT
SUT VIC AB 3-0 SH 18 (SUTURE) ×2 IMPLANT
SUT VIC AB 4-0 PS2 27 (SUTURE) ×2 IMPLANT
SYR BULB IRRIGATION 50ML (SYRINGE) ×1 IMPLANT
SYR CONTROL 10ML LL (SYRINGE) ×2 IMPLANT
TAPE CLOTH SURG 4X10 WHT LF (GAUZE/BANDAGES/DRESSINGS) ×1 IMPLANT
TOWEL OR 17X26 10 PK STRL BLUE (TOWEL DISPOSABLE) ×2 IMPLANT
YANKAUER SUCT BULB TIP 10FT TU (MISCELLANEOUS) ×2 IMPLANT

## 2011-04-29 NOTE — H&P (Signed)
Patient presents with   .  Groin Swelling       left inguinal hernia - referral from Dr. Earl Lites, [facility]     HISTORY: Patient is a 70 year old male referred by his primary physician for her newly diagnosed left inguinal hernia. Patient first noted a bulge in July 2012 after lifting. He has had some minor discomfort. It is always been in the left groin. It has gradually increased in size over the past few months. He denies any signs or symptoms of obstruction. He has had no prior hernia repairs. He has had prior abdominal surgery for a renal cell carcinoma and a bladder tumor.   Past Medical History   Diagnosis  Date   .  Bladder cancer     .  Urothelial cancer     .  History of nephroureterectomy         left kidney   .  Depression     .  Hernia         left    .  Nicotine addiction         pipe   .  Hypertension       Current Outpatient Prescriptions   Medication  Sig  Dispense  Refill   .  aspirin 81 MG tablet  Take 81 mg by mouth daily.            No Known Allergies  Family History   Problem  Relation  Age of Onset   .  Breast cancer  Mother     .  Cancer  Mother  66       breast   .  Liver cancer  Father     .  Cancer  Father  38       colon      Social History   .  Marital Status:  Widowed       Spouse Name:  [name]       Number of Children:  [name]   .  Years of Education:  [name]    Social History Main Topics   .  Smoking status:  Former Smoker       Quit date:  03/24/1999   .  Smokeless tobacco:  Never Used   .  Alcohol Use:  Yes         2-3 drinks in evening   .  Drug Use:  No   .  Sexually Active:  None   Other Topics  Concern   .  None   Social History Narrative   .  None    REVIEW OF SYSTEMS - PERTINENT POSITIVES ONLY: Denies signs or symptoms of obstruction. Minor discomfort left groin.   EXAM: Filed Vitals:     03/24/11 1335   BP:  146/84   Pulse:  62   Temp:  97.9 F (36.6 C)   Resp:  16   HEENT:            normocephalic; pupils equal and reactive; sclerae clear; dentition good; mucous membranes moist NECK:             No palpable nodules; symmetric on extension; no palpable anterior or posterior cervical lymphadenopathy; no supraclavicular masses; no tenderness CHEST:           clear to auscultation bilaterally without rales, rhonchi, or wheezes CARDIAC:       regular rate and rhythm without significant murmur; peripheral pulses are full GU:  Penis and testicles are normal without lesion. Palpation in the right inguinal canal with cough and Valsalva and shows no sign of hernia. Palpation in the left inguinal canal shows an obvious bulge. This is reducible. It augments with cough and Valsalva. It is consistent with left inguinal hernia. EXT:                non-tender without edema; no deformity NEURO:          no gross focal deficits; no sign of tremor   LABORATORY RESULTS: See E-Chart for most recent results   RADIOLOGY RESULTS: See E-Chart or I-Site for most recent results   IMPRESSION: #1 left inguinal hernia, reducible, mildly symptomatic #2 history of renal cell carcinoma #3 history of bladder tumor   PLAN: The patient and I discussed the above issues at length. I provided him with written literature about hernia repair. We discussed repair of the left inguinal hernia with mesh. We discussed the risk of recurrence being approximately 2%. We discussed restrictions on his activity following the procedure. He understands and wishes to proceed. We will make arrangements for surgery at a time convenient for the patient in the near future.   The risks and benefits of the procedure have been discussed at length with the patient.  The patient understands the proposed procedure, potential alternative treatments, and the course of recovery to be expected.  All of the patient's questions have been answered at this time.  The patient wishes to proceed with surgery and will schedule a  date for their procedure through our office staff.   Velora Heckler, MD, FACS General & Endocrine Surgery [facility]         Visit Diagnoses: 1.  Inguinal hernia unilateral, non-recurrent         Primary Care Physician: Lucilla Edin, MD

## 2011-04-29 NOTE — Progress Notes (Signed)
Pt ambulated in hall to [facility].  Pt tolerated well.  Pt voided moderate amount of urine.

## 2011-04-29 NOTE — Op Note (Signed)
Inguinal Hernia, Open, Procedure Note  Pre-operative Diagnosis:  Left inguinal hernia, reducible   Post-operative Diagnosis: same  Surgeon:  Velora Heckler, MD, FACS  Anesthesia:  General  Indications: The patient presented with a left, reducible hernia.    Procedure Details  The patient was seen again in the Holding Room. The risks, benefits, complications, treatment options, and expected outcomes were discussed with the patient.  There was concurrence with the proposed plan, and informed consent was obtained. The site of surgery was properly noted/marked. The patient was taken to the Operating Room, identified by name, and the procedure verified as hernia repair. A Time Out was held and the above information confirmed.  The patient was placed in the supine position and underwent induction of anesthesia.  The lower abdomen and groin was prepped and draped in the usual strict aseptic fashion.  After ascertaining that an adequate level of anesthesia had been obtained, and incision is made in the groin with a #10 blade.  Dissection is carried through the subcutaneous tissues and hemostasis obtained with the electrocautery.  A Gelpi retractor is placed for exposure.  The external oblique fascia is incised in line with it's fibers and extended through the external inguinal ring.  The cord structures are dissected out of the inguinal canal and encircled with a Penrose drain.  The floor of the inguinal canal is dissected out.  The cord is explored.  A moderate hernia sac is identified and dissected out up to the level of the internal inguinal ring.  A high ligation of the sac is performed with 2-0 silk suture.  The sac is excised and discarded.  The floor of the inguinal canal is reconstructed with a sheet of mesh cut to the appropriate dimensions.  It is secured to the pubic tubercle with a 2-0 Novafil suture and along the inguinal ligament with a running 2-0 Novafil suture.  Mesh is split to accommodate  the cord structures.  The superior edge of the mesh is secured to the transversalis and internal oblique muscles with interrupted 2-0 Novafil sutures.  The tails of the mesh are overlapped lateral to the cord structures and secured to the inguinal ligament with interrupted 2-0 Novafil sutures to recreate the internal inguinal ring.  Cord structures are returned to the inguinal canal.  Local anesthetic is infiltrated throughout the field.  External oblique fascia is closed with interrupted 3-0 Vicryl sutures.  Subcutaneous tissues are closed with interrupted 3-0 Vicryl sutures.  Skin is anesthetized with local anesthetic, and the skin edges re-approximated with a running 4-0 Monocryl suture.  Wound is washed and dried and benzoin and steristrips are applied.  A gauze dressing is then applied.  Instrument, sponge, and needle counts were correct prior to closure and at the conclusion of the case.  Velora Heckler, MD, FACS General & Endocrine Surgery [facility]   Findings: Hernia as above  Estimated Blood Loss: Minimal         Specimens: None  Complications: None; patient tolerated the procedure well.         Disposition: PACU - hemodynamically stable.         Condition: stable

## 2011-04-29 NOTE — Anesthesia Preprocedure Evaluation (Addendum)
Anesthesia Evaluation  Patient identified by MRN, date of birth, ID band Patient awake    Reviewed: Allergy & Precautions, H&P , NPO status , Patient's Chart, lab work & pertinent test results, reviewed documented beta blocker date and time   Airway Mallampati: II  Neck ROM: Full    Dental  (+) Lower Dentures and Upper Dentures   Pulmonary neg pulmonary ROS,  clear to auscultation        Cardiovascular neg cardio ROS Regular Normal Denies cardiac symptoms   Neuro/Psych Negative Neurological ROS  Negative Psych ROS   GI/Hepatic negative GI ROS, Neg liver ROS,   Endo/Other  Negative Endocrine ROS  Renal/GU CRFRenal diseasenegative Renal ROSIncreased Cr. 2.34, s/p left nephrectomy for Ca  Genitourinary negative   Musculoskeletal negative musculoskeletal ROS (+)   Abdominal   Peds negative pediatric ROS (+)  Hematology negative hematology ROS (+)   Anesthesia Other Findings   Reproductive/Obstetrics negative OB ROS                           Anesthesia Physical Anesthesia Plan  ASA: III  Anesthesia Plan: General   Post-op Pain Management:    Induction: Intravenous  Airway Management Planned: LMA  Additional Equipment:   Intra-op Plan:   Post-operative Plan: Extubation in OR  Informed Consent:   Plan Discussed with: CRNA and Surgeon  Anesthesia Plan Comments:        Anesthesia Quick Evaluation

## 2011-04-29 NOTE — Anesthesia Postprocedure Evaluation (Signed)
  Anesthesia Post-op Note  Patient: Ricardo Livingston  Procedure(s) Performed:  HERNIA REPAIR INGUINAL ADULT - with mesh  Patient Location: PACU  Anesthesia Type: General  Level of Consciousness: oriented and sedated  Airway and Oxygen Therapy: Patient Spontanous Breathing  Post-op Pain: mild  Post-op Assessment: Post-op Vital signs reviewed, Patient's Cardiovascular Status Stable, Respiratory Function Stable and Patent Airway  Post-op Vital Signs: stable  Complications: No apparent anesthesia complications

## 2011-04-29 NOTE — Transfer of Care (Signed)
Immediate Anesthesia Transfer of Care Note  Patient: Ricardo Livingston  Procedure(s) Performed:  HERNIA REPAIR INGUINAL ADULT - with mesh  Patient Location: PACU  Anesthesia Type: General  Level of Consciousness: awake, oriented and patient cooperative  Airway & Oxygen Therapy: Patient Spontanous Breathing and Patient connected to face mask oxygen  Post-op Assessment: Report given to PACU RN and Post -op Vital signs reviewed and stable  Post vital signs: Reviewed and stable  Complications: No apparent anesthesia complications

## 2011-05-03 ENCOUNTER — Encounter (HOSPITAL_COMMUNITY): Payer: Self-pay | Admitting: Surgery

## 2011-05-24 ENCOUNTER — Ambulatory Visit (INDEPENDENT_AMBULATORY_CARE_PROVIDER_SITE_OTHER): Payer: Medicare Other | Admitting: Surgery

## 2011-05-24 ENCOUNTER — Encounter (INDEPENDENT_AMBULATORY_CARE_PROVIDER_SITE_OTHER): Payer: Self-pay | Admitting: Surgery

## 2011-05-24 VITALS — BP 132/80 | HR 64 | Temp 98.0°F | Resp 16 | Ht 70.0 in | Wt 163.6 lb

## 2011-05-24 DIAGNOSIS — K4090 Unilateral inguinal hernia, without obstruction or gangrene, not specified as recurrent: Secondary | ICD-10-CM

## 2011-05-24 NOTE — Progress Notes (Signed)
Visit Diagnoses: 1. Inguinal hernia unilateral, non-recurrent     HISTORY: Patient returns having undergone left inguinal hernia repair with mesh 3 weeks ago. Postoperative course has been uneventful.  EXAM: Left inguinal incision is well-healed. Minimal soft tissue swelling. No sign of infection. With cough and Valsalva there is no sign of recurrence.  IMPRESSION: Status post left inguinal hernia repair with mesh  PLAN: Patient will begin applying topical creams to his incision. I've asked him to restrict his lifting for one more month. After that he may resume his normal activities without restriction.  Patient will return to see me as needed.   Velora Heckler, MD, FACS General & Endocrine Surgery [facility]

## 2011-05-24 NOTE — Patient Instructions (Signed)
  COCOA BUTTER & VITAMIN E CREAM  ([employer] or other brand)  Apply cocoa butter/vitamin E cream to your incision 2 - 3 times daily.  Massage cream into incision for one minute with each application.  Use sunscreen (50 SPF or higher) for first 6 months after surgery.  You may substitute Mederma or other scar reducing creams as desired.   

## 2011-05-26 ENCOUNTER — Encounter (INDEPENDENT_AMBULATORY_CARE_PROVIDER_SITE_OTHER): Payer: Self-pay

## 2011-05-26 ENCOUNTER — Encounter (INDEPENDENT_AMBULATORY_CARE_PROVIDER_SITE_OTHER): Payer: Self-pay | Admitting: Emergency Medicine

## 2011-07-07 DIAGNOSIS — C44319 Basal cell carcinoma of skin of other parts of face: Secondary | ICD-10-CM | POA: Diagnosis not present

## 2011-07-12 ENCOUNTER — Encounter: Payer: Self-pay | Admitting: *Deleted

## 2011-07-12 DIAGNOSIS — F329 Major depressive disorder, single episode, unspecified: Secondary | ICD-10-CM | POA: Insufficient documentation

## 2011-07-15 DIAGNOSIS — B353 Tinea pedis: Secondary | ICD-10-CM | POA: Diagnosis not present

## 2011-08-05 DIAGNOSIS — N401 Enlarged prostate with lower urinary tract symptoms: Secondary | ICD-10-CM | POA: Diagnosis not present

## 2011-08-05 DIAGNOSIS — Z85528 Personal history of other malignant neoplasm of kidney: Secondary | ICD-10-CM | POA: Diagnosis not present

## 2011-08-05 DIAGNOSIS — Z8551 Personal history of malignant neoplasm of bladder: Secondary | ICD-10-CM | POA: Diagnosis not present

## 2011-08-09 ENCOUNTER — Ambulatory Visit (INDEPENDENT_AMBULATORY_CARE_PROVIDER_SITE_OTHER): Payer: Medicare Other | Admitting: Emergency Medicine

## 2011-08-09 VITALS — BP 142/84 | HR 65 | Temp 98.0°F | Resp 18 | Ht 69.0 in | Wt 163.0 lb

## 2011-08-09 DIAGNOSIS — C679 Malignant neoplasm of bladder, unspecified: Secondary | ICD-10-CM

## 2011-08-09 DIAGNOSIS — F329 Major depressive disorder, single episode, unspecified: Secondary | ICD-10-CM

## 2011-08-09 DIAGNOSIS — F419 Anxiety disorder, unspecified: Secondary | ICD-10-CM

## 2011-08-09 DIAGNOSIS — I10 Essential (primary) hypertension: Secondary | ICD-10-CM | POA: Diagnosis not present

## 2011-08-09 DIAGNOSIS — C689 Malignant neoplasm of urinary organ, unspecified: Secondary | ICD-10-CM

## 2011-08-09 LAB — CBC WITH DIFFERENTIAL/PLATELET
Eosinophils Absolute: 0.3 10*3/uL (ref 0.0–0.7)
Eosinophils Relative: 6 % — ABNORMAL HIGH (ref 0–5)
HCT: 43.3 % (ref 39.0–52.0)
Lymphocytes Relative: 31 % (ref 12–46)
Lymphs Abs: 1.7 10*3/uL (ref 0.7–4.0)
MCH: 32.4 pg (ref 26.0–34.0)
MCV: 93.5 fL (ref 78.0–100.0)
Monocytes Absolute: 0.6 10*3/uL (ref 0.1–1.0)
RBC: 4.63 MIL/uL (ref 4.22–5.81)
RDW: 12.6 % (ref 11.5–15.5)
WBC: 5.6 10*3/uL (ref 4.0–10.5)

## 2011-08-09 LAB — COMPREHENSIVE METABOLIC PANEL
ALT: 11 U/L (ref 0–53)
Alkaline Phosphatase: 62 U/L (ref 39–117)
CO2: 26 mEq/L (ref 19–32)
Creat: 2.22 mg/dL — ABNORMAL HIGH (ref 0.50–1.35)
Sodium: 138 mEq/L (ref 135–145)
Total Bilirubin: 0.5 mg/dL (ref 0.3–1.2)
Total Protein: 6.7 g/dL (ref 6.0–8.3)

## 2011-08-09 MED ORDER — ALPRAZOLAM 0.25 MG PO TABS: ORAL_TABLET | ORAL | Status: DC

## 2011-08-09 MED ORDER — ESCITALOPRAM OXALATE 10 MG PO TABS: ORAL_TABLET | ORAL | Status: DC

## 2011-08-09 NOTE — Progress Notes (Signed)
  Subjective:    Patient ID: Ricardo Livingston, male    DOB: [birth year], 71 y.o.   MRN: [id number]  HPI patient enters for followup of his hypertension. He overall has been doing well. Since his last visit here he has been to see the dermatologist and had a skin cancer removed from right side of his face. He continues to be depressed at times anxious at times. He feels he drinks because he is anxious and lonely at home . He denies chest pain shortness of breath or any new physical problems.    Review of Systems  Constitutional: Negative.   HENT: Negative.   Eyes: Negative.   Respiratory: Negative.   Cardiovascular: Negative.   Gastrointestinal: Negative.   Genitourinary: Negative.   Musculoskeletal: Negative.   Skin:       Recent visit to the dermatologist with removal of the skin cancer right side of his nose       Objective:   Physical Exam  Constitutional: He appears well-nourished.  HENT:  Head: Normocephalic.  Eyes: Pupils are equal, round, and reactive to light.  Neck: Neck supple. No JVD present. No tracheal deviation present. No thyromegaly present.  Cardiovascular: Normal rate and regular rhythm.   Pulmonary/Chest: Effort normal. No respiratory distress. He has no wheezes. He has no rales. He exhibits no tenderness.  Lymphadenopathy:    He has no cervical adenopathy.  Skin: Rash noted. There is erythema.       Patient has actinic changes over his face the          Assessment & Plan:   Overall patient is doing well except for her continued issues with depression. He also is anxious at times. He is interested in trying to medications for stress to see if this might help.

## 2012-01-12 DIAGNOSIS — Z85828 Personal history of other malignant neoplasm of skin: Secondary | ICD-10-CM | POA: Diagnosis not present

## 2012-01-12 DIAGNOSIS — D235 Other benign neoplasm of skin of trunk: Secondary | ICD-10-CM | POA: Diagnosis not present

## 2012-01-12 DIAGNOSIS — L821 Other seborrheic keratosis: Secondary | ICD-10-CM | POA: Diagnosis not present

## 2012-01-12 DIAGNOSIS — L570 Actinic keratosis: Secondary | ICD-10-CM | POA: Diagnosis not present

## 2012-01-12 DIAGNOSIS — D485 Neoplasm of uncertain behavior of skin: Secondary | ICD-10-CM | POA: Diagnosis not present

## 2012-01-12 DIAGNOSIS — C44211 Basal cell carcinoma of skin of unspecified ear and external auricular canal: Secondary | ICD-10-CM | POA: Diagnosis not present

## 2012-01-24 ENCOUNTER — Encounter: Payer: Self-pay | Admitting: Emergency Medicine

## 2012-01-24 ENCOUNTER — Ambulatory Visit (INDEPENDENT_AMBULATORY_CARE_PROVIDER_SITE_OTHER): Payer: Medicare Other | Admitting: Emergency Medicine

## 2012-01-24 VITALS — BP 158/84 | HR 60 | Temp 98.4°F | Resp 16 | Ht 69.5 in | Wt 172.0 lb

## 2012-01-24 DIAGNOSIS — F419 Anxiety disorder, unspecified: Secondary | ICD-10-CM

## 2012-01-24 DIAGNOSIS — F329 Major depressive disorder, single episode, unspecified: Secondary | ICD-10-CM

## 2012-01-24 DIAGNOSIS — C649 Malignant neoplasm of unspecified kidney, except renal pelvis: Secondary | ICD-10-CM

## 2012-01-24 DIAGNOSIS — I10 Essential (primary) hypertension: Secondary | ICD-10-CM | POA: Diagnosis not present

## 2012-01-24 LAB — BASIC METABOLIC PANEL
BUN: 24 mg/dL — ABNORMAL HIGH (ref 6–23)
CO2: 21 mEq/L (ref 19–32)
Calcium: 9.3 mg/dL (ref 8.4–10.5)
Chloride: 107 mEq/L (ref 96–112)
Creat: 1.75 mg/dL — ABNORMAL HIGH (ref 0.50–1.35)

## 2012-01-24 MED ORDER — ALPRAZOLAM 0.25 MG PO TABS: ORAL_TABLET | ORAL | Status: DC

## 2012-01-24 MED ORDER — ESCITALOPRAM OXALATE 5 MG PO TABS: 5.0000 mg | ORAL_TABLET | Freq: Every day | ORAL | Status: DC

## 2012-01-24 MED ORDER — AMLODIPINE BESYLATE 5 MG PO TABS: 5.0000 mg | ORAL_TABLET | Freq: Every day | ORAL | Status: AC

## 2012-01-24 NOTE — Progress Notes (Signed)
  Subjective:    Patient ID: Ricardo Foot., male    DOB: 10-31-1940, 70 y.o.   MRN: [id number]  HPI patient in for recheck. He has been on Lexapro and Xanax. His blood pressures have been borderline high and he is status post removal of a kidney for cancer    Review of Systems     Objective:   Physical Exam patient looks good he is not in any distress. His chest is clear cardiac exam is unremarkable . Blood pressure right arm is 160/80 left arm 150 over 80        Assessment & Plan:  We'll add Norvasc 5 mg one a day refilled his Lexapro and Xanax recheck in 3-4 months for a PE. BMET was done today

## 2012-02-09 DIAGNOSIS — C679 Malignant neoplasm of bladder, unspecified: Secondary | ICD-10-CM | POA: Diagnosis not present

## 2012-02-09 DIAGNOSIS — N200 Calculus of kidney: Secondary | ICD-10-CM | POA: Diagnosis not present

## 2012-02-09 DIAGNOSIS — N189 Chronic kidney disease, unspecified: Secondary | ICD-10-CM | POA: Diagnosis not present

## 2012-02-09 DIAGNOSIS — C659 Malignant neoplasm of unspecified renal pelvis: Secondary | ICD-10-CM | POA: Diagnosis not present

## 2012-02-15 ENCOUNTER — Encounter: Payer: Self-pay | Admitting: Emergency Medicine

## 2012-05-24 DIAGNOSIS — Z23 Encounter for immunization: Secondary | ICD-10-CM | POA: Diagnosis not present

## 2012-07-03 ENCOUNTER — Ambulatory Visit (INDEPENDENT_AMBULATORY_CARE_PROVIDER_SITE_OTHER): Payer: Medicare Other | Admitting: Emergency Medicine

## 2012-07-03 ENCOUNTER — Ambulatory Visit: Payer: Medicare Other

## 2012-07-03 ENCOUNTER — Telehealth: Payer: Self-pay | Admitting: Emergency Medicine

## 2012-07-03 ENCOUNTER — Encounter: Payer: Self-pay | Admitting: Emergency Medicine

## 2012-07-03 VITALS — BP 142/72 | HR 57 | Temp 97.9°F | Resp 16 | Ht 68.5 in | Wt 183.6 lb

## 2012-07-03 DIAGNOSIS — M549 Dorsalgia, unspecified: Secondary | ICD-10-CM

## 2012-07-03 DIAGNOSIS — I10 Essential (primary) hypertension: Secondary | ICD-10-CM

## 2012-07-03 DIAGNOSIS — Z0000 Encounter for general adult medical examination without abnormal findings: Secondary | ICD-10-CM | POA: Diagnosis not present

## 2012-07-03 DIAGNOSIS — Z139 Encounter for screening, unspecified: Secondary | ICD-10-CM

## 2012-07-03 DIAGNOSIS — C649 Malignant neoplasm of unspecified kidney, except renal pelvis: Secondary | ICD-10-CM

## 2012-07-03 DIAGNOSIS — C679 Malignant neoplasm of bladder, unspecified: Secondary | ICD-10-CM

## 2012-07-03 DIAGNOSIS — R0989 Other specified symptoms and signs involving the circulatory and respiratory systems: Secondary | ICD-10-CM

## 2012-07-03 DIAGNOSIS — R0982 Postnasal drip: Secondary | ICD-10-CM

## 2012-07-03 LAB — CBC WITH DIFFERENTIAL/PLATELET
Basophils Absolute: 0.1 10*3/uL (ref 0.0–0.1)
Basophils Relative: 1 % (ref 0–1)
Eosinophils Absolute: 0.3 10*3/uL (ref 0.0–0.7)
MCH: 31.1 pg (ref 26.0–34.0)
MCHC: 34.5 g/dL (ref 30.0–36.0)
Neutrophils Relative %: 48 % (ref 43–77)
Platelets: 179 10*3/uL (ref 150–400)
RDW: 13.6 % (ref 11.5–15.5)

## 2012-07-03 MED ORDER — FLUTICASONE PROPIONATE 50 MCG/ACT NA SUSP: 2.0000 | Freq: Every day | NASAL | Status: DC

## 2012-07-03 NOTE — Progress Notes (Deleted)
  Subjective:    Patient ID: Ricardo Foot., male    DOB: 1941/01/02, 72 y.o.   MRN: [id number]  HPI    Review of Systems     Objective:   Physical Exam        Assessment & Plan:

## 2012-07-03 NOTE — Progress Notes (Signed)
  Subjective:    Patient ID: Ricardo Livingston., male    DOB: 04-Dec-1940, 72 y.o.   MRN: [id number]  HPI    Review of Systems  Constitutional: Negative.   HENT: Positive for congestion and sinus pressure.        Right side Congestion/throat  Eyes: Negative.   Respiratory: Negative.   Cardiovascular: Negative.   Gastrointestinal: Positive for constipation and rectal pain.       Rare or some  Genitourinary: Negative.   Musculoskeletal: Positive for back pain.  Skin:       Spots sometimes  Neurological: Negative.   Hematological: Bruises/bleeds easily.  Psychiatric/Behavioral: Positive for sleep disturbance and decreased concentration.       Patient have question marks(?)       Objective:   Physical Exam [facility] reading (PRIMARY) by  Dr Cleta Alberts chest x-ray shows no acute disease. Lumbar spine films show a significant scoliotic curve with arthritic changes no bony lesions are seen. Patient does have a history of bladder and renal cancer. HEENT exam is unremarkable. There are no lesions seen in the mouth. His neck is supple carotids are 2+ and symmetrical. Chest is clear to both auscultation and percussion. Cardiac exam is regular rate without murmurs rubs or gallops. Abdomen is soft liver and spleen are not larger no other areas of tenderness . Rectal exam was not performed because patient has a history of k and sugar and renal cancer and has regular checkups with his urologist. Examination of back reveals tenderness over lower lumbar spine deep tendon reflexes are symmetrical.        Assessment & Plan:  Patient here for complete physical. He has felt well with no specific problems at the present time except for pain in his lower back. Back films reveal scoliosis with arthritic change but no other abnormalities. I do not see signs of lung CA on his chest x-ray . Routine labs were performed today. He will continue his regular followups with his urologist. Referral made to GI for colonoscopy.  He did complain of some postnasal drip and he was given a prescription for Flonase for this.

## 2012-07-03 NOTE — Progress Notes (Signed)
@[facility] @  Patient ID: Ricardo Livingston. MRN: [id number], DOB: January 17, 1941 72 y.o. Date of Encounter: 07/03/2012, 2:35 PM  Primary Physician: Lucilla Edin, MD  Chief Complaint: Physical (CPE)  HPI: 72 y.o. y/o male with history noted below here for CPE.  Doing well. No issues/complaints.  Review of Systems:  Consitutional: No fever, chills, fatigue, night sweats, lymphadenopathy, or weight changes. Eyes: No visual changes, eye redness, or discharge. ENT/Mouth: Ears: No otalgia, tinnitus, hearing loss, discharge. Nose: No congestion, rhinorrhea, sinus pain, or epistaxis. Throat: He complains of a postnasal drip but no true hoarseness to his   Cardiovascular: No CP, palpitations, diaphoresis, DOE, edema, orthopnea, PND. Respiratory: No cough, hemoptysis, SOB, or wheezing. Gastrointestinal: No anorexia, dysphagia, reflux, pain, nausea, vomiting, hematemesis, diarrhea, constipation, BRBPR, or melena he is a former patient of Dr. orders but has not had a colonoscopy for 10-15 years. He complains today of some intermittent problems with constipation.. Genitourinary: No dysuria, frequency, urgency, hematuria, incontinence, nocturia, decreased urinary stream, discharge, impotence, or testicular pain/masses he has a history of bladder cancer and renal cancer. He sees a urologist once a year. He also has a history of renal dysfunction and has seen Dr. Hyman Hopes approximately one time per year. Musculoskeletal: No decreased ROM, myalgias, stiffness, joint swelling, or weakness. Skin: No rash, erythema, lesion changes, pain, warmth, jaundice, or pruritis. Neurological: No headache, dizziness, syncope, seizures, tremors, memory loss, coordination problems, or paresthesias. Psychological: No anxiety, depression, hallucinations, SI/HI. Endocrine: No fatigue, polydipsia, polyphagia, polyuria, or known diabetes. All other systems were reviewed and are otherwise negative.  Past Medical History  Diagnosis Date   . Urothelial cancer   . History of nephroureterectomy     left kidney  . Depression   . Hernia     left   . Nicotine addiction     pipe  . Bladder cancer   . Renal cancer   . Elevated blood pressure      Past Surgical History  Procedure Date  . Removal of kidney 2011    left  . Bladder removal 2011  . Bladder surgery     bladder cancer removed  . Stomach surgery     stomach surg as infant  . Lithotripsy   . Inguinal hernia repair 04/29/2011    Procedure: HERNIA REPAIR INGUINAL ADULT;  Surgeon: Velora Heckler, MD;  Location: [facility];  Service: General;  Laterality: Left;  with mesh  . Hernia repair 04/29/11    [facility]    Home Meds:  Prior to Admission medications   Medication Sig Start Date End Date Taking? Authorizing Provider  ALPRAZolam Prudy Feeler) 0.25 MG tablet Please take 1/2-1 tablet as needed for stress. 01/24/12  Yes Collene Gobble, MD  amLODipine (NORVASC) 5 MG tablet Take 1 tablet (5 mg total) by mouth daily. 01/24/12 01/23/13 Yes Collene Gobble, MD  aspirin 81 MG tablet Take 81 mg by mouth daily.     Yes Historical Provider, MD  diphenhydrAMINE (BENADRYL) 25 mg capsule Take 25 mg by mouth every 6 (six) hours as needed. For allergies    Yes Historical Provider, MD  escitalopram (LEXAPRO) 5 MG tablet Take 1 tablet (5 mg total) by mouth daily. Take one half tablet daily for anxiety 01/24/12  Yes Collene Gobble, MD  Multiple Vitamin (MULTIVITAMIN) tablet Take 1 tablet by mouth daily.   Yes Historical Provider, MD  Multiple Vitamins-Minerals (MULTIVITAMINS THER. W/MINERALS) TABS Take 1 tablet by mouth daily. Does not take on a regular  basis. Takes when remembers.    Yes Historical Provider, MD    Allergies: No Known Allergies  History   Social History  . Marital Status: Widowed    Spouse Name: [name]    Number of Children: [name]  . Years of Education: [name]   Occupational History  . Not on file.   Social History Main Topics  . Smoking status: Former Smoker    Types: Pipe    Quit  date: 03/24/1999  . Smokeless tobacco: Never Used  . Alcohol Use: Yes     Comment: 2-3 drinks in evening  . Drug Use: No  . Sexually Active: Not on file   Other Topics Concern  . Not on file   Social History Narrative  . No narrative on file    Family History  Problem Relation Age of Onset  . Breast cancer Mother   . Cancer Mother 21    breast  . Liver cancer Father   . Cancer Father 53    colon    Physical Exam:  Blood pressure 142/72, pulse 57, temperature 97.9 F (36.6 C), temperature source Oral, resp. rate 16, height 5' 8.5" (1.74 m), weight 183 lb 9.6 oz (83.28 kg), SpO2 99.00%.  General: Well developed, well nourished, in no acute distress. HEENT: Normocephalic, atraumatic. Conjunctiva pink, sclera non-icteric. Pupils 2 mm constricting to 1 mm, round, regular, and equally reactive to light and accomodation. EOMI. Internal auditory canal clear. TMs with good cone of light and without pathology. Nasal mucosa pink. Nares are without discharge. No sinus tenderness. Oral mucosa pink. Dentition . Pharynx without exudate.   Neck: Supple. Trachea midline. No thyromegaly. Full ROM. No lymphadenopathy. Lungs: Clear to auscultation bilaterally without wheezes, rales, or rhonchi. Breathing is of normal effort and unlabored. Cardiovascular: RRR with S1 S2. No murmurs, rubs, or gallops appreciated. Distal pulses 2+ symmetrically. No carotid or abdominal bruits.  Abdomen: Soft, non-tender, non-distended with normoactive bowel sounds. No hepatosplenomegaly or masses. No rebound/guarding. No CVA tenderness. Without hernias.  Rectal: No external hemorrhoids or fissures. Rectal vault without masses. There is a half by half centimeter thrombosed left hemorrhoid.   Genitourinary:   circumcised male. No penile lesions. Testes descended bilaterally, and smooth without tenderness or masses.  Musculoskeletal: Full range of motion and 5/5 strength throughout. Without swelling, atrophy,  tenderness, crepitus, or warmth. Extremities without clubbing, cyanosis, or edema. Calves supple. There is tenderness over the lower lumbar spine Skin: Warm and moist without erythema, ecchymosis, wounds, or rash. Neuro: A+Ox3. CN II-XII grossly intact. Moves all extremities spontaneously. Full sensation throughout. Normal gait. DTR 2+ throughout upper and lower extremities. Finger to nose intact. Psych:  Responds to questions appropriately with a normal affect.  [facility] reading (PRIMARY) by  DrDaub chest x-ray shows no acute disease. Lumbar spine film shows a scoliotic curve with degenerative changes no other abnormalities. Patient has a history of bladder cancer and renal cancer  Studies: CBC, CMET, Lipid, PSA,   all pending. Patient is       Assessment/Plan:  72 y.o. y/o male here for a full physical exam.  -  Signed, Earl Lites, MD 07/03/2012 2:35 PM

## 2012-07-04 ENCOUNTER — Encounter: Payer: Self-pay | Admitting: Family Medicine

## 2012-07-04 ENCOUNTER — Encounter: Payer: Self-pay | Admitting: Internal Medicine

## 2012-07-04 ENCOUNTER — Other Ambulatory Visit: Payer: Self-pay | Admitting: Emergency Medicine

## 2012-07-04 DIAGNOSIS — R9389 Abnormal findings on diagnostic imaging of other specified body structures: Secondary | ICD-10-CM

## 2012-07-04 DIAGNOSIS — C679 Malignant neoplasm of bladder, unspecified: Secondary | ICD-10-CM | POA: Insufficient documentation

## 2012-07-04 LAB — COMPREHENSIVE METABOLIC PANEL
AST: 15 U/L (ref 0–37)
Alkaline Phosphatase: 67 U/L (ref 39–117)
Glucose, Bld: 93 mg/dL (ref 70–99)
Sodium: 137 mEq/L (ref 135–145)
Total Bilirubin: 0.4 mg/dL (ref 0.3–1.2)
Total Protein: 6.9 g/dL (ref 6.0–8.3)

## 2012-07-04 LAB — LIPID PANEL
LDL Cholesterol: 101 mg/dL — ABNORMAL HIGH (ref 0–99)
Triglycerides: 187 mg/dL — ABNORMAL HIGH (ref <150)
VLDL: 37 mg/dL (ref 0–40)

## 2012-07-04 NOTE — Telephone Encounter (Signed)
Please check on his orders and be sure his carotid Doppler has been scheduled.

## 2012-07-05 ENCOUNTER — Ambulatory Visit
Admission: RE | Admit: 2012-07-05 | Discharge: 2012-07-05 | Disposition: A | Discharge status: Home / Self Care | Payer: Medicare Other | Source: Ambulatory Visit | Attending: Emergency Medicine | Admitting: Emergency Medicine

## 2012-07-05 DIAGNOSIS — I658 Occlusion and stenosis of other precerebral arteries: Secondary | ICD-10-CM | POA: Diagnosis not present

## 2012-07-05 DIAGNOSIS — R0989 Other specified symptoms and signs involving the circulatory and respiratory systems: Secondary | ICD-10-CM

## 2012-07-05 NOTE — Telephone Encounter (Signed)
Yes, today at 2:15. Ricardo Livingston

## 2012-07-09 ENCOUNTER — Other Ambulatory Visit: Payer: Self-pay | Admitting: *Deleted

## 2012-07-09 ENCOUNTER — Other Ambulatory Visit: Payer: Self-pay | Admitting: Radiology

## 2012-07-09 DIAGNOSIS — R9389 Abnormal findings on diagnostic imaging of other specified body structures: Secondary | ICD-10-CM

## 2012-07-09 DIAGNOSIS — E041 Nontoxic single thyroid nodule: Secondary | ICD-10-CM

## 2012-07-11 ENCOUNTER — Ambulatory Visit
Admission: RE | Admit: 2012-07-11 | Discharge: 2012-07-11 | Disposition: A | Discharge status: Home / Self Care | Payer: Medicare Other | Source: Ambulatory Visit | Attending: Emergency Medicine | Admitting: Emergency Medicine

## 2012-07-11 DIAGNOSIS — R9389 Abnormal findings on diagnostic imaging of other specified body structures: Secondary | ICD-10-CM

## 2012-07-11 DIAGNOSIS — E041 Nontoxic single thyroid nodule: Secondary | ICD-10-CM

## 2012-07-11 DIAGNOSIS — M545 Low back pain: Secondary | ICD-10-CM | POA: Diagnosis not present

## 2012-07-11 DIAGNOSIS — M533 Sacrococcygeal disorders, not elsewhere classified: Secondary | ICD-10-CM | POA: Diagnosis not present

## 2012-07-12 DIAGNOSIS — L821 Other seborrheic keratosis: Secondary | ICD-10-CM | POA: Diagnosis not present

## 2012-07-12 DIAGNOSIS — Z85828 Personal history of other malignant neoplasm of skin: Secondary | ICD-10-CM | POA: Diagnosis not present

## 2012-07-12 DIAGNOSIS — L570 Actinic keratosis: Secondary | ICD-10-CM | POA: Diagnosis not present

## 2012-07-12 DIAGNOSIS — L219 Seborrheic dermatitis, unspecified: Secondary | ICD-10-CM | POA: Diagnosis not present

## 2012-07-12 DIAGNOSIS — D485 Neoplasm of uncertain behavior of skin: Secondary | ICD-10-CM | POA: Diagnosis not present

## 2012-07-20 ENCOUNTER — Other Ambulatory Visit: Payer: Self-pay | Admitting: Radiology

## 2012-07-20 DIAGNOSIS — F419 Anxiety disorder, unspecified: Secondary | ICD-10-CM

## 2012-07-20 MED ORDER — ALPRAZOLAM 0.25 MG PO TABS: ORAL_TABLET | ORAL | Status: DC

## 2012-07-20 NOTE — Telephone Encounter (Signed)
Please advise on Alprazolam renewal  

## 2012-07-25 ENCOUNTER — Ambulatory Visit (AMBULATORY_SURGERY_CENTER): Payer: Medicare Other | Admitting: *Deleted

## 2012-07-25 VITALS — Ht 68.0 in | Wt 185.8 lb

## 2012-07-25 DIAGNOSIS — Z1211 Encounter for screening for malignant neoplasm of colon: Secondary | ICD-10-CM

## 2012-07-25 DIAGNOSIS — Z86010 Personal history of colonic polyps: Secondary | ICD-10-CM

## 2012-07-25 MED ORDER — MOVIPREP 100 G PO SOLR: 1.0000 | Freq: Once | ORAL | Status: DC

## 2012-08-04 ENCOUNTER — Other Ambulatory Visit: Payer: Self-pay

## 2012-08-08 ENCOUNTER — Encounter: Payer: Self-pay | Admitting: Internal Medicine

## 2012-08-08 ENCOUNTER — Ambulatory Visit (AMBULATORY_SURGERY_CENTER): Payer: Medicare Other | Admitting: Internal Medicine

## 2012-08-08 VITALS — BP 164/60 | HR 53 | Temp 98.2°F | Resp 22 | Ht 68.0 in | Wt 185.0 lb

## 2012-08-08 DIAGNOSIS — K645 Perianal venous thrombosis: Secondary | ICD-10-CM | POA: Diagnosis not present

## 2012-08-08 DIAGNOSIS — Z86010 Personal history of colonic polyps: Secondary | ICD-10-CM | POA: Diagnosis not present

## 2012-08-08 DIAGNOSIS — Z1211 Encounter for screening for malignant neoplasm of colon: Secondary | ICD-10-CM | POA: Diagnosis not present

## 2012-08-08 DIAGNOSIS — D126 Benign neoplasm of colon, unspecified: Secondary | ICD-10-CM

## 2012-08-08 DIAGNOSIS — I10 Essential (primary) hypertension: Secondary | ICD-10-CM | POA: Diagnosis not present

## 2012-08-08 MED ORDER — SODIUM CHLORIDE 0.9 % IV SOLN: 500.0000 mL | INTRAVENOUS | Status: DC

## 2012-08-08 MED ORDER — PRAMOXINE-HC 1-1 % EX CREA: TOPICAL_CREAM | Freq: Three times a day (TID) | CUTANEOUS | Status: DC

## 2012-08-08 NOTE — Progress Notes (Signed)
I went over discharge instructions with the pt's lady friend.  I did not discuss the finding with her.  The coloscopy report, handouts of polyps, diverticulosis, high fiber diet and hemorrhhoids and discharge instructions were enclosed in an envelope and given to the pt. Maw  No complaints noted in the recovery room. Maw  Patient did not experience any of the following events: a burn prior to discharge; a fall within the facility; wrong site/side/patient/procedure/implant event; or a hospital transfer or hospital admission upon discharge from the facility. ([id number])Patient did not have preoperative order for IV antibiotic SSI prophylaxis. [phone])

## 2012-08-08 NOTE — Progress Notes (Signed)
Called to room to assist during endoscopic procedure.  Patient ID and intended procedure confirmed with present staff. Received instructions for my participation in the procedure from the performing physician.  

## 2012-08-08 NOTE — Progress Notes (Signed)
When the pt was brought to the recovery room he said he wanted his friend to go out to her car and that he did not want her to hear the results from the doctor.  She went to her car and got her book.  Dr. Terri Piedra came and and spoke with the pt about the results of his colonoscopy.  Dr. Rhea Belton left the recovery room.  The pt's friend returned to the recovery room.  The pt proceed to tell her what Dr. Rhea Belton  Told him. Maw

## 2012-08-08 NOTE — Progress Notes (Signed)
Lidocaine-40mg IV prior to Propofol InductionPropofol given over incremental dosages 

## 2012-08-08 NOTE — Patient Instructions (Addendum)
Handouts were enclosed in a envelope for polyps, diverticulosois, high fiber diet and hemorrhoids.  Please hold aspirin, aspirin products and any anti-inflammatory medications for 2 weeks.  You may resume your other current medications today.  Dr. Rhea Belton sent a prescription for analpram for hemorrhoids to your pharmacy.  Please call if any questions or concerns.    YOU HAD AN ENDOSCOPIC PROCEDURE TODAY AT THE [facility]: Refer to the procedure report that was given to you for any specific questions about what was found during the examination.  If the procedure report does not answer your questions, please call your gastroenterologist to clarify.  If you requested that your care partner not be given the details of your procedure findings, then the procedure report has been included in a sealed envelope for you to review at your convenience later.  YOU SHOULD EXPECT: Some feelings of bloating in the abdomen. Passage of more gas than usual.  Walking can help get rid of the air that was put into your GI tract during the procedure and reduce the bloating. If you had a lower endoscopy (such as a colonoscopy or flexible sigmoidoscopy) you may notice spotting of blood in your stool or on the toilet paper. If you underwent a bowel prep for your procedure, then you may not have a normal bowel movement for a few days.  DIET: Your first meal following the procedure should be a light meal and then it is ok to progress to your normal diet.  A half-sandwich or bowl of soup is an example of a good first meal.  Heavy or fried foods are harder to digest and may make you feel nauseous or bloated.  Likewise meals heavy in dairy and vegetables can cause extra gas to form and this can also increase the bloating.  Drink plenty of fluids but you should avoid alcoholic beverages for 24 hours.  ACTIVITY: Your care partner should take you home directly after the procedure.  You should plan to take it easy, moving  slowly for the rest of the day.  You can resume normal activity the day after the procedure however you should NOT DRIVE or use heavy machinery for 24 hours (because of the sedation medicines used during the test).    SYMPTOMS TO REPORT IMMEDIATELY: A gastroenterologist can be reached at any hour.  During normal business hours, 8:30 AM to 5:00 PM Monday through Friday, call [phone].  After hours and on weekends, please call the GI answering service at [phone] who will take a message and have the physician on call contact you.   Following lower endoscopy (colonoscopy or flexible sigmoidoscopy):  Excessive amounts of blood in the stool  Significant tenderness or worsening of abdominal pains  Swelling of the abdomen that is new, acute  Fever of 100F or higher  Following upper endoscopy (EGD)  Vomiting of blood or coffee ground material  New chest pain or pain under the shoulder blades  Painful or persistently difficult swallowing  New shortness of breath  Fever of 100F or higher  Black, tarry-looking stools  FOLLOW UP: If any biopsies were taken you will be contacted by phone or by letter within the next 1-3 weeks.  Call your gastroenterologist if you have not heard about the biopsies in 3 weeks.  Our staff will call the home number listed on your records the next business day following your procedure to check on you and address any questions or concerns that you may have at  that time regarding the information given to you following your procedure. This is a courtesy call and so if there is no answer at the home number and we have not heard from you through the emergency physician on call, we will assume that you have returned to your regular daily activities without incident.  SIGNATURES/CONFIDENTIALITY: You and/or your care partner have signed paperwork which will be entered into your electronic medical record.  These signatures attest to the fact that that the information  above on your After Visit Summary has been reviewed and is understood.  Full responsibility of the confidentiality of this discharge information lies with you and/or your care-partner.

## 2012-08-08 NOTE — Op Note (Signed)
 [facility] [address] [place], [address]   COLONOSCOPY PROCEDURE REPORT  PATIENT: Ricardo Livingston, Ricardo Livingston  MR#: [id number] BIRTHDATE: Aug 14, 1940 , 71  yrs. old GENDER: Male ENDOSCOPIST: Beverley Fiedler, MD REFERRED XB:JYNW, Viviann Spare PROCEDURE DATE:  08/08/2012 PROCEDURE:   Colonoscopy with snare polypectomy ASA CLASS:   Class III INDICATIONS:elevated risk screening and Last colonoscopy performed 2007 (Dr.  Virginia Rochester). MEDICATIONS: MAC sedation, administered by CRNA and propofol (Diprivan) 250mg  IV  DESCRIPTION OF PROCEDURE:   After the risks benefits and alternatives of the procedure were thoroughly explained, informed consent was obtained.  A digital rectal exam revealed external hemorrhoids.   The LB CF-H180AL [id number]  endoscope was introduced through the anus and advanced to the cecum, which was identified by both the appendix and ileocecal valve. No adverse events experienced.   The quality of the prep was good, using MoviPrep The instrument was then slowly withdrawn as the colon was fully examined.    COLON FINDINGS: A sessile polyp measuring 8 mm in size was found in the transverse colon.  A polypectomy was performed using snare cautery.  The resection was complete and the polyp tissue was completely retrieved.   A sessile polyp measuring 5 mm in size was found in the descending colon.  A polypectomy was performed with a cold snare.  The resection was complete and the polyp tissue was completely retrieved.   A pedunculated polyp measuring 15 mm in size was found in the sigmoid colon.  A polypectomy was performed using snare cautery.  The resection was complete and the polyp tissue was completely retrieved.   A sessile polyp measuring 4 mm in size was found in the rectosigmoid colon.  A polypectomy was performed with a cold snare.  The resection was complete and the polyp tissue was not retrieved.   Severe diverticulosis was noted in the descending colon and  sigmoid colon.   Moderate sized external hemorrhoids were found.  Retroflexed views revealed external thrombosed hemorrhoids. The time to cecum=7 minutes 11 seconds.  Withdrawal time=16 minutes 27 seconds.  The scope was withdrawn and the procedure completed.  COMPLICATIONS: There were no complications.  ENDOSCOPIC IMPRESSION: 1.   Sessile polyp measuring 8 mm in size was found in the transverse colon; polypectomy was performed using snare cautery 2.   Sessile polyp measuring 5 mm in size was found in the descending colon; polypectomy was performed with a cold snare 3.   Pedunculated polyp measuring 15 mm in size was found in the sigmoid colon; polypectomy was performed using snare cautery 4.   Sessile polyp measuring 4 mm in size was found in the rectosigmoid colon; polypectomy was performed with a cold snare 5.   Severe diverticulosis was noted in the descending colon and sigmoid colon 6.   Moderate sized external hemorrhoids   RECOMMENDATIONS: 1.  Hold aspirin, aspirin products, and anti-inflammatory medication for 2 weeks. 2.  High fiber diet 3.  Repeat Colonoscopy in 3 years. 4.  You will receive a letter within 1-2 weeks with the results of your biopsy as well as final recommendations.  Please call my office if you have not received a letter after 3 weeks. 5.  Prescription for Analpram applied three times daily to external hemorrhoid until better   eSigned:  Beverley Fiedler, MD 08/08/2012 12:21 PM   cc: The Patient and Lesle Chris, MD   PATIENT NAME:  Ricardo Livingston MR#: [id number]

## 2012-08-09 ENCOUNTER — Telehealth: Payer: Self-pay | Admitting: *Deleted

## 2012-08-09 NOTE — Telephone Encounter (Signed)
  Follow up Call-  Call back number 08/08/2012  Post procedure Call Back phone  # [phone]  Permission to leave phone message Yes     No answer and answering machine did not pick up to leave message

## 2012-08-14 ENCOUNTER — Encounter: Payer: Self-pay | Admitting: Internal Medicine

## 2013-01-09 ENCOUNTER — Telehealth: Payer: Self-pay

## 2013-01-09 DIAGNOSIS — F419 Anxiety disorder, unspecified: Secondary | ICD-10-CM

## 2013-01-09 NOTE — Telephone Encounter (Signed)
Pharm request RF of alprazolam 0.25 mg.

## 2013-01-09 NOTE — Telephone Encounter (Signed)
Okay to call in a refill of the patient's alprazolam

## 2013-01-11 MED ORDER — ALPRAZOLAM 0.25 MG PO TABS: ORAL_TABLET | ORAL | Status: DC

## 2013-01-11 NOTE — Telephone Encounter (Signed)
Called in RF to [facility]

## 2013-01-23 ENCOUNTER — Other Ambulatory Visit: Payer: Self-pay

## 2013-02-21 DIAGNOSIS — C679 Malignant neoplasm of bladder, unspecified: Secondary | ICD-10-CM | POA: Diagnosis not present

## 2013-02-21 DIAGNOSIS — N401 Enlarged prostate with lower urinary tract symptoms: Secondary | ICD-10-CM | POA: Diagnosis not present

## 2013-04-02 ENCOUNTER — Ambulatory Visit (INDEPENDENT_AMBULATORY_CARE_PROVIDER_SITE_OTHER): Payer: Medicare Other | Admitting: Emergency Medicine

## 2013-04-02 ENCOUNTER — Encounter: Payer: Self-pay | Admitting: Emergency Medicine

## 2013-04-02 ENCOUNTER — Ambulatory Visit: Payer: Medicare Other

## 2013-04-02 VITALS — BP 134/64 | HR 68 | Temp 98.3°F | Resp 16 | Ht 68.75 in | Wt 174.0 lb

## 2013-04-02 DIAGNOSIS — E041 Nontoxic single thyroid nodule: Secondary | ICD-10-CM

## 2013-04-02 DIAGNOSIS — M549 Dorsalgia, unspecified: Secondary | ICD-10-CM

## 2013-04-02 DIAGNOSIS — Z23 Encounter for immunization: Secondary | ICD-10-CM | POA: Diagnosis not present

## 2013-04-02 DIAGNOSIS — R109 Unspecified abdominal pain: Secondary | ICD-10-CM | POA: Diagnosis not present

## 2013-04-02 DIAGNOSIS — M4120 Other idiopathic scoliosis, site unspecified: Secondary | ICD-10-CM

## 2013-04-02 DIAGNOSIS — R220 Localized swelling, mass and lump, head: Secondary | ICD-10-CM

## 2013-04-02 DIAGNOSIS — M419 Scoliosis, unspecified: Secondary | ICD-10-CM

## 2013-04-02 DIAGNOSIS — R221 Localized swelling, mass and lump, neck: Secondary | ICD-10-CM

## 2013-04-02 DIAGNOSIS — R1030 Lower abdominal pain, unspecified: Secondary | ICD-10-CM

## 2013-04-02 NOTE — Progress Notes (Addendum)
  Subjective:    Patient ID: Ricardo Livingston., male    DOB: [birth year], 72 y.o.   MRN: [id number]  HPI patient enters because he felt a lump in his neck. 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 No biopsy was recommended at that time. He also has had a soreness in the left side of his back. Pertinent history is that the patient  He sees Dr. Isabel Caprice regular basis. He also has a history of bladder cancer. He is worried he has a hernia on the right side .    Review of Systems     Objective:   Physical Exam patient is alert and cooperative. He is not ill-appearing. His neck is supple. There are no definite masses palpable in his neck. I had him remove his lower dentures I could not feel any masses in the floor of the mouth . His chest is clear to both auscultation and percussion. Heart regular rate no murmurs rubs or gallops appreciated . Abdomen is soft liver spleen not enlarged and no areas of tenderness . 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 No lytic lesions are seen.         Assessment & Plan:    I have made referrals to ENT in for a repeat ultrasound the neck with the history of a right thyroid nodule. He has some weakness of the right external inguinal ring but no definite hernia parents

## 2013-04-10 ENCOUNTER — Ambulatory Visit
Admission: RE | Admit: 2013-04-10 | Discharge: 2013-04-10 | Disposition: A | Discharge status: Home / Self Care | Payer: Medicare Other | Source: Ambulatory Visit | Attending: Emergency Medicine | Admitting: Emergency Medicine

## 2013-04-10 DIAGNOSIS — E042 Nontoxic multinodular goiter: Secondary | ICD-10-CM | POA: Diagnosis not present

## 2013-04-10 DIAGNOSIS — E041 Nontoxic single thyroid nodule: Secondary | ICD-10-CM

## 2013-04-15 ENCOUNTER — Telehealth: Payer: Self-pay | Admitting: Radiology

## 2013-04-15 DIAGNOSIS — E041 Nontoxic single thyroid nodule: Secondary | ICD-10-CM

## 2013-04-15 NOTE — Telephone Encounter (Signed)
Called patient to advise. He is currently out of town, he would like to proceed with Biopsy but will be out of town until Friday.

## 2013-04-15 NOTE — Telephone Encounter (Signed)
Message copied by Caffie Damme on Mon Apr 15, 2013  4:36 PM ------      Message from: Lesle Chris A      Created: Thu Apr 11, 2013  7:49 AM       The right thyroid nodule is stable. We can either repeat the thyroid ultrasound in 6 months or  I can go ahead and schedule him to have a biopsy done at interventional radiology. Please have him let me know which he prefers. ------

## 2013-04-16 ENCOUNTER — Telehealth: Payer: Self-pay | Admitting: Radiology

## 2013-04-16 DIAGNOSIS — E041 Nontoxic single thyroid nodule: Secondary | ICD-10-CM

## 2013-04-16 NOTE — Telephone Encounter (Signed)
Go ahead and cancel the biopsy. Call patient  the radiologist feels that the area is too small to get to with biopsy. I would advise that we repeat an ultrasound in 6 months and make sure it does not change

## 2013-04-16 NOTE — Telephone Encounter (Signed)
Phone call from [facility], area with thyroid too small to biopsy, but the can try if you REALLY want them to. Please advise. Tammy ph# [phone]

## 2013-04-18 NOTE — Telephone Encounter (Signed)
Patient advised.

## 2013-04-18 NOTE — Telephone Encounter (Signed)
See notes under next phone mess.

## 2013-04-18 NOTE — Addendum Note (Signed)
Addended byCaffie Damme on: 04/18/2013 09:58 AM   Modules accepted: Orders

## 2013-04-18 NOTE — Telephone Encounter (Signed)
Thanks. I called Ricardo Livingston to advise, called patient also to advise. Have ordered US for 6 months

## 2013-04-18 NOTE — Telephone Encounter (Signed)
Pt called back and LM on VM stating that he had missed a call and thinks it is related to biopsy. Since the VM came in after Amy's call this morning, I called him back and [facility] for pt giving him the information Amy had given him. Asked for CB w/any ?s.

## 2013-05-15 ENCOUNTER — Telehealth: Payer: Self-pay

## 2013-05-15 DIAGNOSIS — F419 Anxiety disorder, unspecified: Secondary | ICD-10-CM

## 2013-05-15 NOTE — Telephone Encounter (Signed)
Pharm reqs RF of alprazolam 0.25 mg. Dr Cleta Alberts, I have pended 1 RF, but if you'd like to add more RFs you may change it.

## 2013-05-15 NOTE — Telephone Encounter (Signed)
This is fine I will sign it when available

## 2013-05-17 MED ORDER — ALPRAZOLAM 0.25 MG PO TABS: ORAL_TABLET | ORAL | Status: DC

## 2013-05-17 NOTE — Telephone Encounter (Signed)
Called in for patient.

## 2013-07-16 ENCOUNTER — Encounter: Payer: Medicare Other | Admitting: Emergency Medicine

## 2013-07-16 ENCOUNTER — Encounter: Payer: Self-pay | Admitting: Emergency Medicine

## 2013-07-16 ENCOUNTER — Other Ambulatory Visit: Payer: Self-pay | Admitting: Emergency Medicine

## 2013-07-16 ENCOUNTER — Ambulatory Visit (INDEPENDENT_AMBULATORY_CARE_PROVIDER_SITE_OTHER): Payer: Medicare Other | Admitting: Emergency Medicine

## 2013-07-16 VITALS — BP 146/62 | HR 62 | Temp 98.8°F | Resp 16 | Ht 68.25 in | Wt 168.4 lb

## 2013-07-16 DIAGNOSIS — R5381 Other malaise: Secondary | ICD-10-CM

## 2013-07-16 DIAGNOSIS — Z139 Encounter for screening, unspecified: Secondary | ICD-10-CM | POA: Diagnosis not present

## 2013-07-16 DIAGNOSIS — K4090 Unilateral inguinal hernia, without obstruction or gangrene, not specified as recurrent: Secondary | ICD-10-CM | POA: Diagnosis not present

## 2013-07-16 DIAGNOSIS — Z0000 Encounter for general adult medical examination without abnormal findings: Secondary | ICD-10-CM | POA: Diagnosis not present

## 2013-07-16 DIAGNOSIS — F32A Depression, unspecified: Secondary | ICD-10-CM

## 2013-07-16 DIAGNOSIS — R5383 Other fatigue: Secondary | ICD-10-CM

## 2013-07-16 DIAGNOSIS — F329 Major depressive disorder, single episode, unspecified: Secondary | ICD-10-CM

## 2013-07-16 DIAGNOSIS — E041 Nontoxic single thyroid nodule: Secondary | ICD-10-CM | POA: Diagnosis not present

## 2013-07-16 DIAGNOSIS — Z125 Encounter for screening for malignant neoplasm of prostate: Secondary | ICD-10-CM

## 2013-07-16 LAB — COMPLETE METABOLIC PANEL WITH GFR
ALBUMIN: 4.5 g/dL (ref 3.5–5.2)
ALT: 11 U/L (ref 0–53)
AST: 17 U/L (ref 0–37)
Alkaline Phosphatase: 67 U/L (ref 39–117)
BUN: 21 mg/dL (ref 6–23)
CALCIUM: 9.9 mg/dL (ref 8.4–10.5)
CHLORIDE: 106 meq/L (ref 96–112)
CO2: 23 mEq/L (ref 19–32)
CREATININE: 1.83 mg/dL — AB (ref 0.50–1.35)
GFR, Est African American: 41 mL/min — ABNORMAL LOW
GFR, Est Non African American: 36 mL/min — ABNORMAL LOW
Glucose, Bld: 94 mg/dL (ref 70–99)
POTASSIUM: 4.3 meq/L (ref 3.5–5.3)
SODIUM: 140 meq/L (ref 135–145)
TOTAL PROTEIN: 7.4 g/dL (ref 6.0–8.3)
Total Bilirubin: 0.6 mg/dL (ref 0.3–1.2)

## 2013-07-16 LAB — CBC WITH DIFFERENTIAL/PLATELET
BASOS ABS: 0 10*3/uL (ref 0.0–0.1)
Basophils Relative: 1 % (ref 0–1)
Eosinophils Absolute: 0.2 10*3/uL (ref 0.0–0.7)
Eosinophils Relative: 4 % (ref 0–5)
HCT: 46.9 % (ref 39.0–52.0)
Hemoglobin: 15.9 g/dL (ref 13.0–17.0)
LYMPHS ABS: 2.6 10*3/uL (ref 0.7–4.0)
LYMPHS PCT: 40 % (ref 12–46)
MCH: 31.9 pg (ref 26.0–34.0)
MCHC: 33.9 g/dL (ref 30.0–36.0)
MCV: 94 fL (ref 78.0–100.0)
Monocytes Absolute: 0.7 10*3/uL (ref 0.1–1.0)
Monocytes Relative: 10 % (ref 3–12)
NEUTROS ABS: 3 10*3/uL (ref 1.7–7.7)
NEUTROS PCT: 45 % (ref 43–77)
PLATELETS: 182 10*3/uL (ref 150–400)
RBC: 4.99 MIL/uL (ref 4.22–5.81)
RDW: 13.7 % (ref 11.5–15.5)
WBC: 6.6 10*3/uL (ref 4.0–10.5)

## 2013-07-16 LAB — IFOBT (OCCULT BLOOD): IFOBT: POSITIVE

## 2013-07-16 LAB — POCT URINALYSIS DIPSTICK
GLUCOSE UA: NEGATIVE
Leukocytes, UA: NEGATIVE
NITRITE UA: NEGATIVE
Protein, UA: 30
RBC UA: NEGATIVE
Spec Grav, UA: >=1.03
Urobilinogen, UA: 0.2
pH, UA: 5

## 2013-07-16 LAB — PSA, MEDICARE: PSA: 1.03 ng/mL (ref <=4.00)

## 2013-07-16 LAB — TSH: TSH: 2.915 u[IU]/mL (ref 0.350–4.500)

## 2013-07-16 LAB — T4, FREE: FREE T4: 1.32 ng/dL (ref 0.80–1.80)

## 2013-07-16 LAB — PSA, DIAGNOSTIC (PROSTATE SPECIFIC AG): Prostate Specific Ag: 1.03 ng/mL (ref 0.00–4.00)

## 2013-07-16 NOTE — Progress Notes (Addendum)
Subjective:  This chart was scribed for Ricardo Queen, MD by Roxan Diesel, ED scribe.  This patient was seen in [name] and the patient's care was started at 10:49 AM.   Patient ID: Ricardo Livingston., male    DOB: 01-19-1941, 73 y.o.   MRN: [id number]  Chief Complaint  Patient presents with  . Annual Exam    HPI  HPI Comments: [name]. is a 73 y.o. male who presents to [facility] for an annual exam.  Pt admits to regular noncompliance with his medications including for his blood pressure.  He states he has a pill container but "just haven't got in the habit."  He states he does not have a routine and this is why it is difficult for him.    He also has h/o depression and states he has been feeling very low-energy lately.  He states "I've let things slide by, I feel like I'm in a rut."  He feels that he needs to be more active but he feels unable to make the first step.  He states that even though he can be "very analytical about myself, there's an emotional block."  He states "I just seem kind of stuck in general" and "I'm overwhelmed with my life right now, and what's going on, I've let stuff go and that makes it worse."  He states his main recent stressors are life changes and "I'm just kind of stuck, I'm just not doing things."  He has made attempts at exercise including buying a bike but "I just don't keep at it."  He has not joined any groups recently. He is interested in reading and art.  He has a cat at home.  He plays music at home.  He is not involved in church.  His wife died in 31-Oct-2009.  In regard to whether this may be the trigger for his depression he states "yes and no, I do miss her," but "it's just that demarcation of something, and I can't get over the hump."  He does not feel that he needs grief counseling.    He smokes a pipe daily and drinks 2-3 alcoholic drinks each evening.  He uses marijuana socially when friends bring it to him.  He denies any other  illicit drug use.  Pt notes a "strip" in his right groin that intermittently "comes out" when he coughs, with occasional associated "stinging pain."   Patient Active Problem List   Diagnosis Date Noted  . Bladder cancer 07/04/2012  . Depression   . Elevated blood pressure   . Inguinal hernia unilateral, non-recurrent 03/24/2011  . CAROTID BRUIT 07/13/2010    Past Medical History  Diagnosis Date  . Urothelial cancer   . History of nephroureterectomy     left kidney  . Depression   . Hernia     left   . Nicotine addiction     pipe  . Bladder cancer   . Renal cancer   . Elevated blood pressure   . Anxiety   . Hypertension   . Allergy   . Anemia     Past Surgical History  Procedure Laterality Date  . Removal of kidney  2011    left  . Bladder surgery      bladder cancer removed  . Stomach surgery      stomach surg as infant  . Lithotripsy    . Inguinal hernia repair  04/29/2011    Procedure: HERNIA  REPAIR INGUINAL ADULT;  Surgeon: [name], MD;  Location: [facility];  Service: General;  Laterality: Left;  with mesh    Prior to Admission medications   Medication Sig Start Date End Date Taking? Authorizing Provider  ALPRAZolam Duanne Moron) 0.25 MG tablet Please take 1/2-1 tablet as needed for stress. 05/15/13  Yes Darlyne Russian, MD  aspirin 81 MG tablet Take 81 mg by mouth daily.     Yes Historical Provider, MD  diphenhydrAMINE (BENADRYL) 25 mg capsule Take 25 mg by mouth every 6 (six) hours as needed. For allergies    Yes Historical Provider, MD  escitalopram (LEXAPRO) 5 MG tablet Take 1 tablet (5 mg total) by mouth daily. Take one half tablet daily for anxiety 01/24/12  Yes Darlyne Russian, MD  Multiple Vitamins-Minerals (MULTIVITAMINS THER. W/MINERALS) TABS Take 1 tablet by mouth daily. Does not take on a regular basis. Takes when remembers.    Yes Historical Provider, MD  amLODipine (NORVASC) 5 MG tablet Take 1 tablet (5 mg total) by mouth daily. 01/24/12 01/23/13  Darlyne Russian, MD  fluticasone (FLONASE) 50 MCG/ACT nasal spray Place 2 sprays into the nose daily. 07/03/12   Darlyne Russian, MD  pramoxine-hydrocortisone [facility]) cream Apply topically 3 (three) times daily. 08/08/12   Jerene Bears, MD    History   Social History  . Marital Status: Widowed    Spouse Name: [name]    Number of Children: [name]  . Years of Education: [name]   Occupational History  . Not on file.   Social History Main Topics  . Smoking status: Current Every Day Smoker    Types: Pipe    Last Attempt to Quit: 03/24/1999  . Smokeless tobacco: Never Used  . Alcohol Use: Yes     Comment: 2-3 drinks in evening  . Drug Use: No  . Sexual Activity: Not on file   Other Topics Concern  . Not on file   Social History Narrative  . No narrative on file     Review of Systems  Genitourinary:       "strip" in groin area  Musculoskeletal: Positive for back pain.  Allergic/Immunologic: Positive for environmental allergies.  Psychiatric/Behavioral:       Low energy level  All other systems reviewed and are negative.        Objective:   Physical Exam CONSTITUTIONAL: Well developed/well nourished HEAD: Normocephalic/atraumatic EYES: EOMI/PERRL ENMT: Mucous membranes moist NECK: supple no meningeal signs SPINE:entire spine nontender CV: S1/S2 noted, no murmurs/rubs/gallops noted LUNGS: Lungs are clear to auscultation bilaterally, no apparent distress ABDOMEN: soft, nontender, no rebound or guarding there is a reducible right inguinal hernia there are 2 healed scars in the midline. GU:no cva tenderness NEURO: Pt is awake/alert, moves all extremitiesx4 EXTREMITIES: pulses normal, full ROM SKIN: warm, color normal patient has actinic changes around both nares, PSYCH: no abnormalities of mood noted   BP 146/62  Pulse 62  Temp(Src) 98.8 F (37.1 C) (Oral)  Resp 16  Ht 5' 8.25" (1.734 m)  Wt 168 lb 6.4 oz (76.386 kg)  BMI 25.40 kg/m2  SpO2 99%   Results for orders placed  in visit on 07/16/13  POCT URINALYSIS DIPSTICK      Result Value Range   Color, UA yellow     Clarity, UA clear     Glucose, UA neg     Bilirubin, UA small     Ketones, UA trace     Spec Grav,  UA >=1.030     Blood, UA neg     pH, UA 5.0     Protein, UA 30     Urobilinogen, UA 0.2     Nitrite, UA neg     Leukocytes, UA Negative    IFOBT (OCCULT BLOOD)      Result Value Range   IFOBT Positive           Assessment & Plan:    His biggest  problem has been depression. I encouraged him to be more active and I did not feel increasing medication was the answer. He was not interested in counseling. Referral also made to Gen. surgery because of a right inguinal hernia . He had a heme positive stool he had a colonoscopy last year with removal of this single polyp and severe diverticulosis . He has a thyroid nodule and needs repeat ultrasound in 6 months   I personally performed the services described in this documentation, which was scribed in my presence. The recorded information has been reviewed and is accurate.

## 2013-07-17 DIAGNOSIS — I10 Essential (primary) hypertension: Secondary | ICD-10-CM | POA: Diagnosis not present

## 2013-07-17 DIAGNOSIS — N183 Chronic kidney disease, stage 3 unspecified: Secondary | ICD-10-CM | POA: Diagnosis not present

## 2013-07-23 ENCOUNTER — Other Ambulatory Visit: Payer: Self-pay | Admitting: Emergency Medicine

## 2013-07-24 ENCOUNTER — Other Ambulatory Visit: Payer: Self-pay

## 2013-07-24 DIAGNOSIS — F419 Anxiety disorder, unspecified: Secondary | ICD-10-CM

## 2013-07-24 MED ORDER — ALPRAZOLAM 0.25 MG PO TABS: ORAL_TABLET | ORAL | Status: DC

## 2013-07-24 NOTE — Telephone Encounter (Signed)
Pharm reqs RF of alprazolam 0.25. Pended.

## 2013-07-24 NOTE — Telephone Encounter (Signed)
Called pt to check current dose because last sig had both take 1 tab and take 1/2 tab QD. Pt clarified that he is taking 1 tablet QD. I RFd at this dose.

## 2013-07-24 NOTE — Telephone Encounter (Signed)
Called in.

## 2013-07-29 ENCOUNTER — Encounter (INDEPENDENT_AMBULATORY_CARE_PROVIDER_SITE_OTHER): Payer: Self-pay | Admitting: Surgery

## 2013-07-29 ENCOUNTER — Ambulatory Visit (INDEPENDENT_AMBULATORY_CARE_PROVIDER_SITE_OTHER): Payer: Medicare Other | Admitting: Surgery

## 2013-07-29 VITALS — BP 126/70 | HR 78 | Temp 98.6°F | Resp 14 | Ht 70.0 in | Wt 178.2 lb

## 2013-07-29 DIAGNOSIS — K4090 Unilateral inguinal hernia, without obstruction or gangrene, not specified as recurrent: Secondary | ICD-10-CM | POA: Insufficient documentation

## 2013-07-29 NOTE — Progress Notes (Signed)
General Surgery [facility]  Chief Complaint  Patient presents with  . New Evaluation    eval RIH - referral from Dr. Nena Jordan    HISTORY: Patient is a 73 year old male known to my practice from previous left inguinal hernia repair with mesh performed in November of 2012. Over the past 2 months the patient has noted a bulge in the right groin. He initially presented with discomfort and pain radiating down the medial right thigh. On physical examination 2 weeks ago a right inguinal hernia was diagnosed by the patient's primary care physician. He is now referred for repair.  Patient denies any previous right inguinal hernia symptoms. He has had no signs or symptoms of obstruction. Patient had no complications following his left inguinal hernia repair. There is been no sign of recurrence.  Past Medical History  Diagnosis Date  . Urothelial cancer   . History of nephroureterectomy     left kidney  . Depression   . Hernia     left   . Nicotine addiction     pipe  . Bladder cancer   . Renal cancer   . Elevated blood pressure   . Anxiety   . Hypertension   . Allergy   . Anemia     Current Outpatient Prescriptions  Medication Sig Dispense Refill  . ALPRAZolam (XANAX) 0.25 MG tablet Please take 1/2-1 tablet as needed for stress.  30 tablet  0  . amLODipine (NORVASC) 5 MG tablet Take 1 tablet (5 mg total) by mouth daily.  30 tablet  11  . aspirin 81 MG tablet Take 81 mg by mouth daily.        . diphenhydrAMINE (BENADRYL) 25 mg capsule Take 25 mg by mouth every 6 (six) hours as needed. For allergies       . escitalopram (LEXAPRO) 10 MG tablet Take 1 tablet (10 mg total) by mouth daily.  30 tablet  5  . Multiple Vitamins-Minerals (MULTIVITAMINS THER. W/MINERALS) TABS Take 1 tablet by mouth daily. Does not take on a regular basis. Takes when remembers.        No current facility-administered medications for this visit.    No Known Allergies  Family History   Problem Relation Age of Onset  . Breast cancer Mother   . Cancer Mother 45    breast  . Liver cancer Father   . Cancer Father 12    colon  . Colon cancer Father   . Lymphoma Sister   . Alcohol abuse Daughter     abcess on lung  . Heart disease Brother     stent  . Mental retardation Son   . Esophageal cancer Neg Hx   . Rectal cancer Neg Hx   . Stomach cancer Neg Hx     History   Social History  . Marital Status: Widowed    Spouse Name: [name]    Number of Children: [name]  . Years of Education: [name]   Social History Main Topics  . Smoking status: Current Every Day Smoker    Types: Pipe    Last Attempt to Quit: 03/24/1999  . Smokeless tobacco: Current User     Comment: puffs on a pipe  . Alcohol Use: Yes     Comment: 2-3 drinks in evening  . Drug Use: No  . Sexual Activity: None   Other Topics Concern  . None   Social History Narrative  . None    REVIEW OF SYSTEMS -  PERTINENT POSITIVES ONLY: Bulge in right groin. Denies signs or symptoms of obstruction. Occasional "stinging" pain down the right medial thigh.  EXAM: Filed Vitals:   07/29/13 1047  BP: 126/70  Pulse: 78  Temp: 98.6 F (37 C)  Resp: 14    GENERAL: well-developed, well-nourished, no acute distress HEENT: normocephalic; pupils equal and reactive; sclerae clear; dentition good; mucous membranes moist NECK:  symmetric on extension; no palpable anterior or posterior cervical lymphadenopathy; no supraclavicular masses; no tenderness CHEST: clear to auscultation bilaterally without rales, rhonchi, or wheezes CARDIAC: regular rate and rhythm without significant murmur; peripheral pulses are full ABDOMEN: soft without distension; bowel sounds present; no mass; no hepatosplenomegaly; no hernia; well-healed lower midline incision GU:  Normal male genitalia; well-healed left inguinal incision; palpation in left inguinal canal with cough and Valsalva shows no sign of recurrence; obvious bulging right groin;  palpation in right inguinal canal shows an obvious bulge which is soft and reducible; spontaneously prolapses with Valsalva EXT:  non-tender without edema; no deformity NEURO: no gross focal deficits; no sign of tremor   LABORATORY RESULTS: See [facility] (CHL-Epic) for most recent results  RADIOLOGY RESULTS: See [facility] (CHL-Epic) for most recent results  IMPRESSION: Right inguinal hernia, reducible, symptomatic  PLAN: The patient and I discussed the above findings at length. I provided him with written literature to review regarding hernia repair with mesh. We discussed restrictions on his activities following the procedure. We discussed the risk of recurrence. He understands and wishes to proceed with surgery in the near future.  The risks and benefits of the procedure have been discussed at length with the patient.  The patient understands the proposed procedure, potential alternative treatments, and the course of recovery to be expected.  All of the patient's questions have been answered at this time.  The patient wishes to proceed with surgery.  [name], MD, [facility]  Primary Care Physician: Jenny Reichmann, MD

## 2013-07-29 NOTE — Patient Instructions (Signed)
[facility]  HERNIA REPAIR POST OP INSTRUCTIONS  Always review your discharge instruction sheet given to you by the facility where your surgery was performed.  1. A  prescription for pain medication may be given to you upon discharge.  Take your pain medication as prescribed.  If narcotic pain medicine is not needed, then you may take acetaminophen (Tylenol) or ibuprofen (Advil) as needed.  2. Take your usually prescribed medications unless otherwise directed.  3. If you need a refill on your pain medication, please contact your pharmacy.  They will contact our office to request authorization. Prescriptions will not be filled after 5 pm daily or on weekends.  4. You should follow a light diet the first 24 hours after arrival home, such as soup and crackers or toast.  Be sure to include plenty of fluids daily.  Resume your normal diet the day after surgery.  5. Most patients will experience some swelling and bruising around the surgical site.  Ice packs and reclining will help.  Swelling and bruising can take several days to resolve.   6. It is common to experience some constipation if taking pain medication after surgery.  Increasing fluid intake and taking a stool softener (such as Colace) will usually help or prevent this problem from occurring.  A mild laxative (Milk of Magnesia or Miralax) should be taken according to package directions if there are no bowel movements after 48 hours.  7. Unless discharge instructions indicate otherwise, you may remove your bandages 24-48 hours after surgery, and you may shower at that time.  You may have steri-strips (small skin tapes) in place directly over the incision.  These strips should be left on the skin for 7-10 days.  If your surgeon used skin glue on the incision, you may shower in 24 hours.  The glue will flake off over the next 2-3 weeks.  Any sutures or staples will be removed at the office during your follow-up  visit.  8. ACTIVITIES:  You may resume regular (light) daily activities beginning the next day-such as daily self-care, walking, climbing stairs-gradually increasing activities as tolerated.  You may have sexual intercourse when it is comfortable.  Refrain from any heavy lifting or straining until approved by your doctor.  You may drive when you are no longer taking prescription pain medication, you can comfortably wear a seatbelt, and you can safely maneuver your car and apply brakes.  9. You should see your doctor in the office for a follow-up appointment approximately 2-3 weeks after your surgery.  Make sure that you call for this appointment within a day or two after you arrive home to insure a convenient appointment time. 10.   WHEN TO CALL YOUR DOCTOR: 1. Fever greater than 101.0 2. Inability to urinate 3. Persistent nausea and/or vomiting 4. Extreme swelling or bruising 5. Continued bleeding from incision 6. Increased pain, redness, or drainage from the incision  The clinic staff is available to answer your questions during regular business hours.  Please don't hesitate to call and ask to speak to one of the nurses for clinical concerns.  If you have a medical emergency, go to the nearest emergency room or call 911.  A surgeon from [facility] is always on call for the hospital.   [facility] [address], [address], [place], [place]  [address]  [phone] ? [phone] ? FAX [phone]  [email or web address]   

## 2013-08-08 DIAGNOSIS — K4090 Unilateral inguinal hernia, without obstruction or gangrene, not specified as recurrent: Secondary | ICD-10-CM | POA: Diagnosis not present

## 2013-08-12 ENCOUNTER — Telehealth (INDEPENDENT_AMBULATORY_CARE_PROVIDER_SITE_OTHER): Payer: Self-pay

## 2013-08-12 ENCOUNTER — Other Ambulatory Visit (INDEPENDENT_AMBULATORY_CARE_PROVIDER_SITE_OTHER): Payer: Self-pay

## 2013-08-12 DIAGNOSIS — R109 Unspecified abdominal pain: Secondary | ICD-10-CM

## 2013-08-12 MED ORDER — HYDROCODONE-ACETAMINOPHEN 5-325 MG PO TABS: 1.0000 | ORAL_TABLET | Freq: Four times a day (QID) | ORAL | Status: DC | PRN

## 2013-08-12 NOTE — Telephone Encounter (Signed)
Patient is asking for refill of hydrocodone 5/325mg  ; Dr. Ninfa Linden authorized refill; pt aware

## 2013-08-28 ENCOUNTER — Encounter (INDEPENDENT_AMBULATORY_CARE_PROVIDER_SITE_OTHER): Payer: Medicare Other | Admitting: Surgery

## 2013-08-29 DIAGNOSIS — C679 Malignant neoplasm of bladder, unspecified: Secondary | ICD-10-CM | POA: Diagnosis not present

## 2013-08-29 DIAGNOSIS — N200 Calculus of kidney: Secondary | ICD-10-CM | POA: Diagnosis not present

## 2013-08-29 DIAGNOSIS — C659 Malignant neoplasm of unspecified renal pelvis: Secondary | ICD-10-CM | POA: Diagnosis not present

## 2013-08-29 DIAGNOSIS — N401 Enlarged prostate with lower urinary tract symptoms: Secondary | ICD-10-CM | POA: Diagnosis not present

## 2013-09-04 ENCOUNTER — Other Ambulatory Visit (INDEPENDENT_AMBULATORY_CARE_PROVIDER_SITE_OTHER): Payer: Self-pay | Admitting: General Surgery

## 2013-09-04 NOTE — Telephone Encounter (Signed)
Rx for Norcor never picked up; Rx destroyed on 09/04/13.

## 2013-09-19 ENCOUNTER — Encounter (INDEPENDENT_AMBULATORY_CARE_PROVIDER_SITE_OTHER): Payer: Self-pay | Admitting: Surgery

## 2013-09-19 ENCOUNTER — Ambulatory Visit (INDEPENDENT_AMBULATORY_CARE_PROVIDER_SITE_OTHER): Payer: Medicare Other | Admitting: Surgery

## 2013-09-19 VITALS — BP 150/88 | HR 68 | Temp 97.3°F | Resp 14 | Ht 70.0 in | Wt 168.8 lb

## 2013-09-19 DIAGNOSIS — K4090 Unilateral inguinal hernia, without obstruction or gangrene, not specified as recurrent: Secondary | ICD-10-CM

## 2013-09-19 NOTE — Patient Instructions (Signed)
  CARE OF INCISION   Apply cocoa butter/vitamin E cream (Palmer's brand) to your incision 2 - 3 times daily.  Massage cream into incision for one minute with each application.  Use sunscreen (50 SPF or higher) for first 6 months after surgery if area is exposed to sun.  You may alternate Mederma or other scar reducing cream with cocoa butter cream if desired.       Ricardo Bozard M. Kaegan Stigler, MD, FACS      [facility]      Office: [phone]    

## 2013-09-19 NOTE — Progress Notes (Signed)
General Surgery [facility]  Chief Complaint  Patient presents with  . Routine Post Op    RIH repair 08/08/2013 at [facility]    HISTORY: Patient is a 73 year old male who underwent right inguinal hernia repair with mesh on 08/08/2013. Postoperative course was relatively uneventful. He did note some swelling in the right testicle. He also noted a burning sensation on the medial right thigh. Both of these symptoms have resolved.  EXAM: Surgical wound is well-healed. Minimal soft tissue swelling. Palpation in the inguinal canal shows no sign of recurrent hernia. Palpation in the scrotum shows a normal right testicle without mass or lesion.  IMPRESSION: Status post right inguinal hernia repair with mesh  PLAN: Patient is released to full activity without restriction.  Patient will return for surgical care as needed.  [name], MD, [facility]   Visit Diagnoses: 1. Inguinal hernia, right, reducible

## 2013-10-15 ENCOUNTER — Encounter: Payer: Self-pay | Admitting: Emergency Medicine

## 2013-10-15 ENCOUNTER — Ambulatory Visit (INDEPENDENT_AMBULATORY_CARE_PROVIDER_SITE_OTHER): Payer: Medicare Other | Admitting: Emergency Medicine

## 2013-10-15 VITALS — BP 130/66 | HR 53 | Temp 97.9°F | Resp 18 | Ht 69.5 in | Wt 167.0 lb

## 2013-10-15 DIAGNOSIS — E041 Nontoxic single thyroid nodule: Secondary | ICD-10-CM

## 2013-10-15 NOTE — Progress Notes (Signed)
   This chart was scribed for Darlyne Russian, MD by Elby Beck, Scribe. This patient was seen in room 22 and the patient's care was started at 11:12 AM.  Subjective:    Patient ID: Ricardo Murphy., male    DOB: 1941/06/06, 73 y.o.   MRN: [id number]  HPI  HPI Comments: Ricardo Seybold. is a 73 y.o. male who presents to [facility] for a follow-up visit. When last seen here, he was dealing with depression. He notes that his depression comes and goes. He also has a questionable right inguinal hernia that he may or may not have surgery on. He has seen Dr. Harlow Asa for this. He has had surgery to remove a right thyroid nodule since he was last seen here. He states that he feels fine other than having decreased energy. He denies chest pain, abdominal pain or any other recent pain or symptoms.    Review of Systems  Constitutional: Positive for fatigue.  Cardiovascular: Negative for chest pain.  Gastrointestinal: Negative for abdominal pain.  Psychiatric/Behavioral:       Depression       Objective:   Physical Exam  CONSTITUTIONAL: Well developed/well nourished HEAD: Normocephalic/atraumatic EYES: EOMI/PERRL ENMT: Mucous membranes moist NECK: supple no meningeal signs SPINE:entire spine nontender CV: S1/S2 noted, no murmurs/rubs/gallops noted LUNGS: Lungs are clear to auscultation bilaterally, no apparent distress ABDOMEN: soft, nontender, no rebound or guarding GU:no cva tenderness NEURO: Pt is awake/alert, moves all extremitiesx4 EXTREMITIES: pulses normal, full ROM SKIN: warm, color normal PSYCH: no abnormalities of mood noted     BP 130/66  Pulse 53  Temp(Src) 97.9 F (36.6 C)  Resp 18  Ht 5' 9.5" (1.765 m)  Wt 167 lb (75.751 kg)  BMI 24.32 kg/m2  SpO2 100% Assessment & Plan:  No diagnosis found. Patient had a 0.9 cm or right thyroid nodule. We will repeat the ultrasound today to see if there's been any increase in size. I cannot palpate the lesion  on exam. Overall he is doing well. He does seem to be still depressed in some ways but overall everything is about the same I personally performed the services described in this documentation, which was scribed in my presence. The recorded information has been reviewed and is accurate.

## 2013-10-15 NOTE — Progress Notes (Signed)
   Subjective:    Patient ID: Ricardo Livingston., male    DOB: March 10, 1941, 73 y.o.   MRN: [id number]  HPI    Review of Systems     Objective:   Physical Exam        Assessment & Plan:

## 2013-10-29 ENCOUNTER — Ambulatory Visit
Admission: RE | Admit: 2013-10-29 | Discharge: 2013-10-29 | Disposition: A | Discharge status: Home / Self Care | Payer: Medicare Other | Source: Ambulatory Visit | Attending: Emergency Medicine | Admitting: Emergency Medicine

## 2013-10-29 DIAGNOSIS — E041 Nontoxic single thyroid nodule: Secondary | ICD-10-CM | POA: Diagnosis not present

## 2013-11-07 ENCOUNTER — Other Ambulatory Visit: Payer: Self-pay

## 2013-11-07 DIAGNOSIS — F419 Anxiety disorder, unspecified: Secondary | ICD-10-CM

## 2013-11-07 MED ORDER — ALPRAZOLAM 0.25 MG PO TABS: ORAL_TABLET | ORAL | Status: DC

## 2013-11-07 NOTE — Telephone Encounter (Signed)
Pharm reqs RF of alprazolam 0.25 mg

## 2013-11-08 NOTE — Telephone Encounter (Signed)
Called in.

## 2014-02-10 ENCOUNTER — Other Ambulatory Visit: Payer: Self-pay | Admitting: Dermatology

## 2014-02-10 DIAGNOSIS — D485 Neoplasm of uncertain behavior of skin: Secondary | ICD-10-CM | POA: Diagnosis not present

## 2014-02-10 DIAGNOSIS — L821 Other seborrheic keratosis: Secondary | ICD-10-CM | POA: Diagnosis not present

## 2014-02-10 DIAGNOSIS — C44519 Basal cell carcinoma of skin of other part of trunk: Secondary | ICD-10-CM | POA: Diagnosis not present

## 2014-02-10 DIAGNOSIS — D1801 Hemangioma of skin and subcutaneous tissue: Secondary | ICD-10-CM | POA: Diagnosis not present

## 2014-02-10 DIAGNOSIS — L820 Inflamed seborrheic keratosis: Secondary | ICD-10-CM | POA: Diagnosis not present

## 2014-02-10 DIAGNOSIS — L219 Seborrheic dermatitis, unspecified: Secondary | ICD-10-CM | POA: Diagnosis not present

## 2014-02-20 DIAGNOSIS — C44519 Basal cell carcinoma of skin of other part of trunk: Secondary | ICD-10-CM | POA: Diagnosis not present

## 2014-03-04 DIAGNOSIS — N401 Enlarged prostate with lower urinary tract symptoms: Secondary | ICD-10-CM | POA: Diagnosis not present

## 2014-03-04 DIAGNOSIS — C679 Malignant neoplasm of bladder, unspecified: Secondary | ICD-10-CM | POA: Diagnosis not present

## 2014-03-04 DIAGNOSIS — N200 Calculus of kidney: Secondary | ICD-10-CM | POA: Diagnosis not present

## 2014-03-04 DIAGNOSIS — N529 Male erectile dysfunction, unspecified: Secondary | ICD-10-CM | POA: Diagnosis not present

## 2014-03-04 DIAGNOSIS — N138 Other obstructive and reflux uropathy: Secondary | ICD-10-CM | POA: Diagnosis not present

## 2014-03-18 ENCOUNTER — Encounter: Payer: Self-pay | Admitting: Cardiology

## 2014-03-19 ENCOUNTER — Other Ambulatory Visit: Payer: Self-pay | Admitting: Dermatology

## 2014-03-19 DIAGNOSIS — D485 Neoplasm of uncertain behavior of skin: Secondary | ICD-10-CM | POA: Diagnosis not present

## 2014-03-19 DIAGNOSIS — C44320 Squamous cell carcinoma of skin of unspecified parts of face: Secondary | ICD-10-CM | POA: Diagnosis not present

## 2014-03-19 DIAGNOSIS — L570 Actinic keratosis: Secondary | ICD-10-CM | POA: Diagnosis not present

## 2014-03-19 DIAGNOSIS — Z85828 Personal history of other malignant neoplasm of skin: Secondary | ICD-10-CM | POA: Diagnosis not present

## 2014-03-19 DIAGNOSIS — D2360 Other benign neoplasm of skin of unspecified upper limb, including shoulder: Secondary | ICD-10-CM | POA: Diagnosis not present

## 2014-03-19 DIAGNOSIS — L259 Unspecified contact dermatitis, unspecified cause: Secondary | ICD-10-CM | POA: Diagnosis not present

## 2014-03-19 DIAGNOSIS — L821 Other seborrheic keratosis: Secondary | ICD-10-CM | POA: Diagnosis not present

## 2014-03-19 DIAGNOSIS — L820 Inflamed seborrheic keratosis: Secondary | ICD-10-CM | POA: Diagnosis not present

## 2014-03-26 IMAGING — CR DG THORACIC SPINE 2V
2 series · 2 of 2 positions shown · non-contrast
Comparison: Chest CT, 07/01/2010

CLINICAL DATA: Back pain.

EXAM:
THORACIC SPINE - 2 VIEW

[AP]
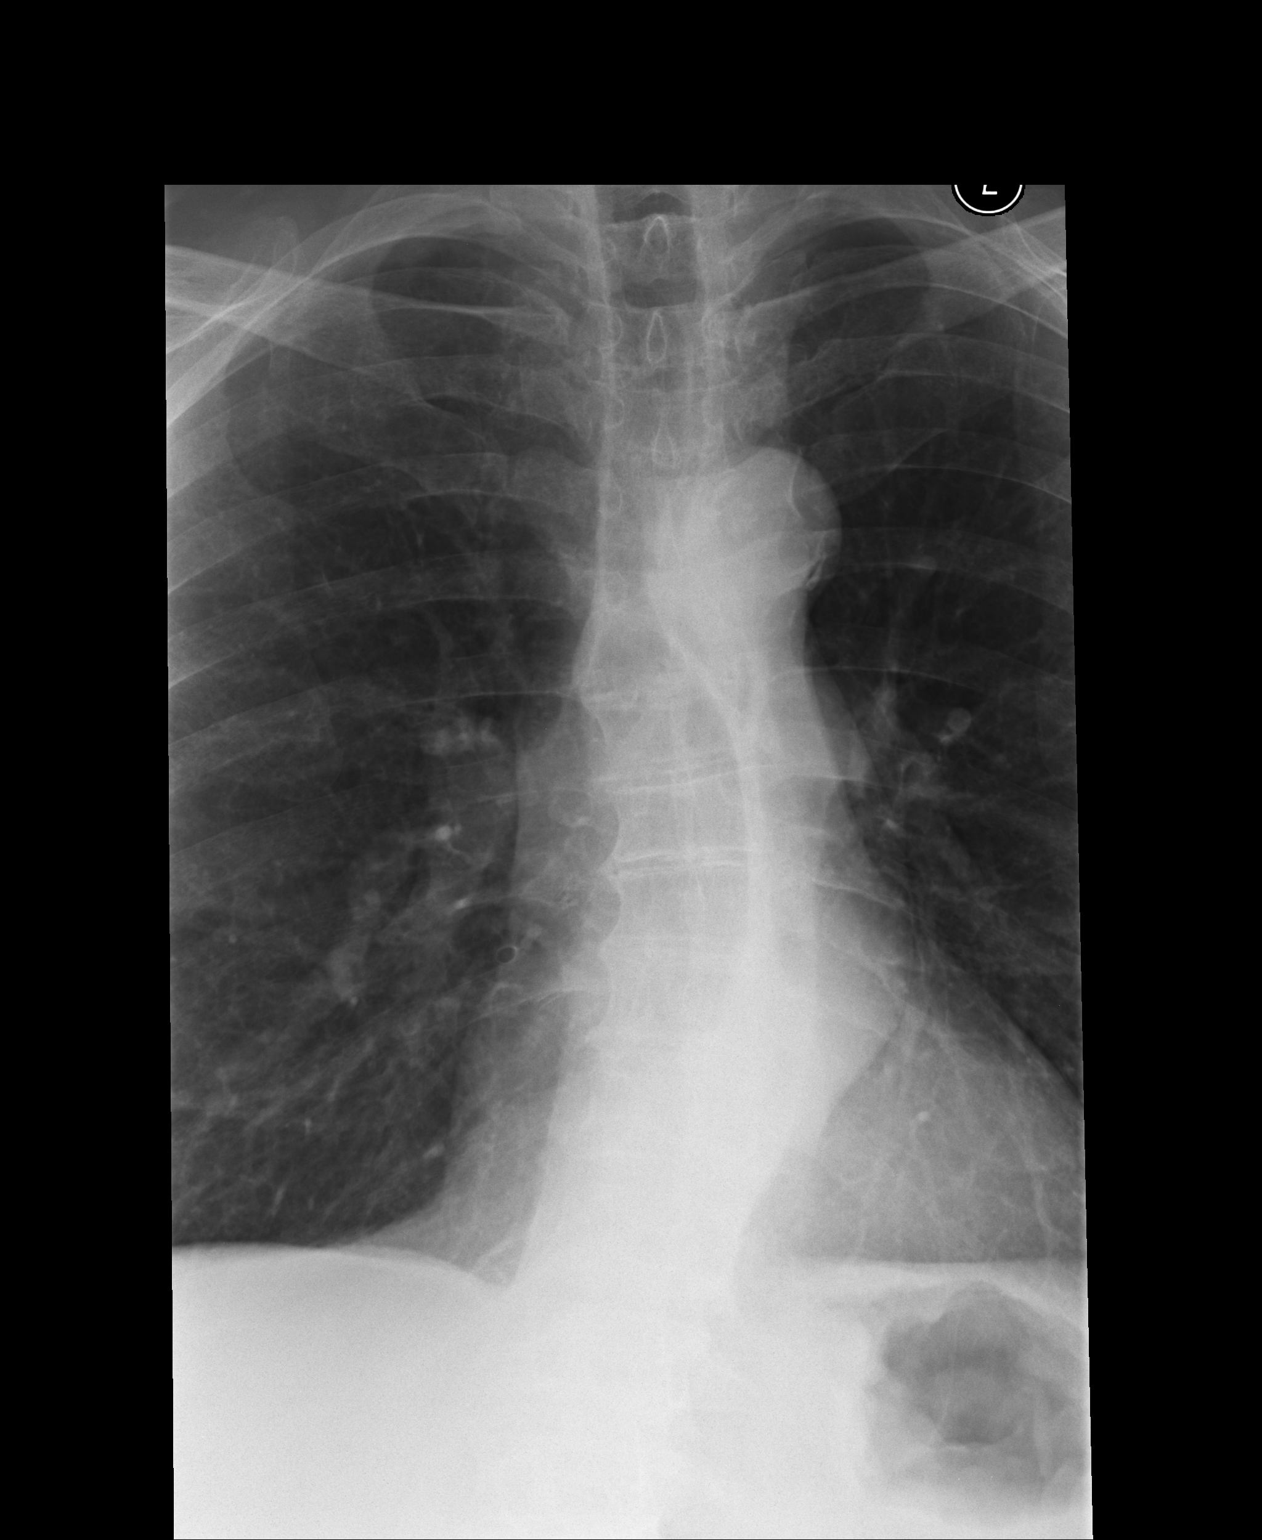

[lateral]
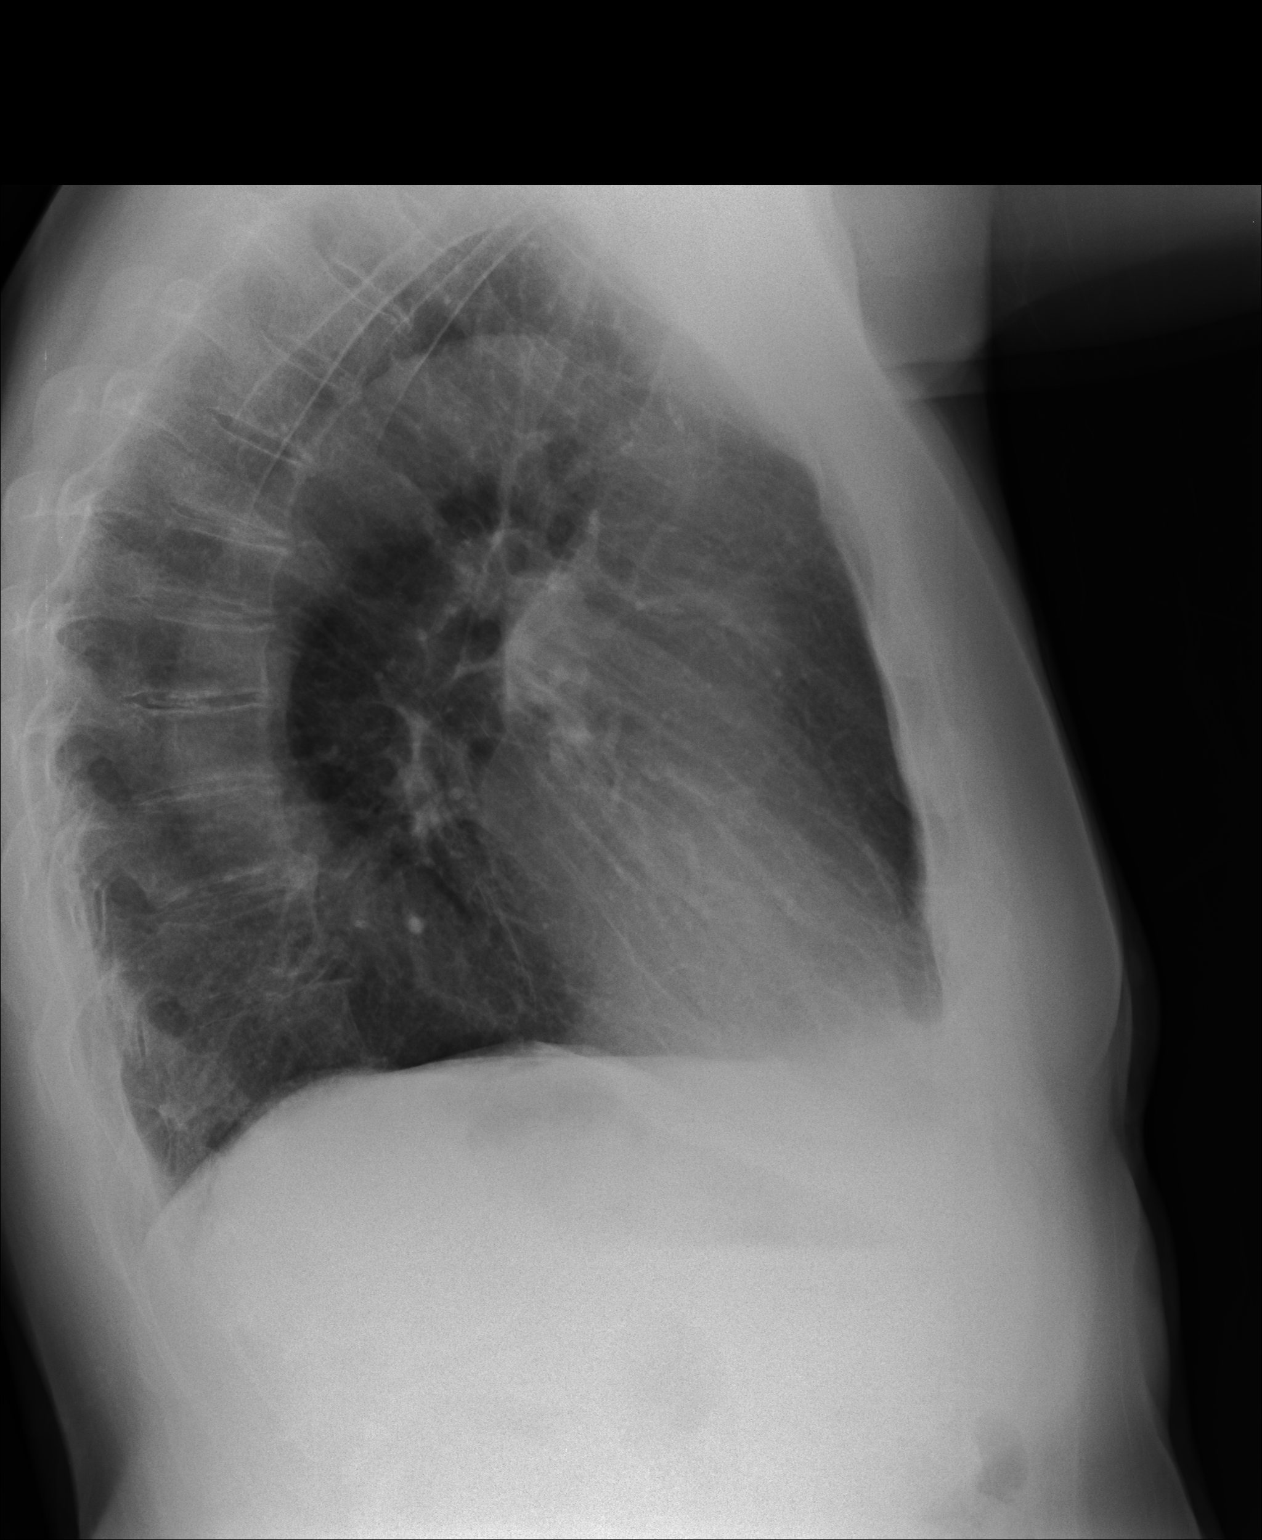

[2 of 2 positions shown; findings below may reference images not displayed]

FINDINGS: Mild levoscoliosis with the apex in the mid thoracic spine. This is
stable. No fracture. No spondylolisthesis. There are mild
degenerative changes most notable along the mid and lower thoracic
spine. The bones are demineralized. The soft tissues are
unremarkable.
IMPRESSION: No fracture or acute finding.

Mild levoscoliosis and degenerative changes as described.

## 2014-04-03 IMAGING — US US SOFT TISSUE HEAD/NECK
1 series · 14 of 25 positions shown · non-contrast
Comparison: 07/11/2012.

CLINICAL DATA: Right thyroid nodule.

EXAM:
THYROID ULTRASOUND
TECHNIQUE: Ultrasound examination of the thyroid gland and adjacent soft
tissues was performed.

[Series 1: us soft tissue head/neck · 0.10mm/px · 14 of 51 slices shown]
[im 1/51]
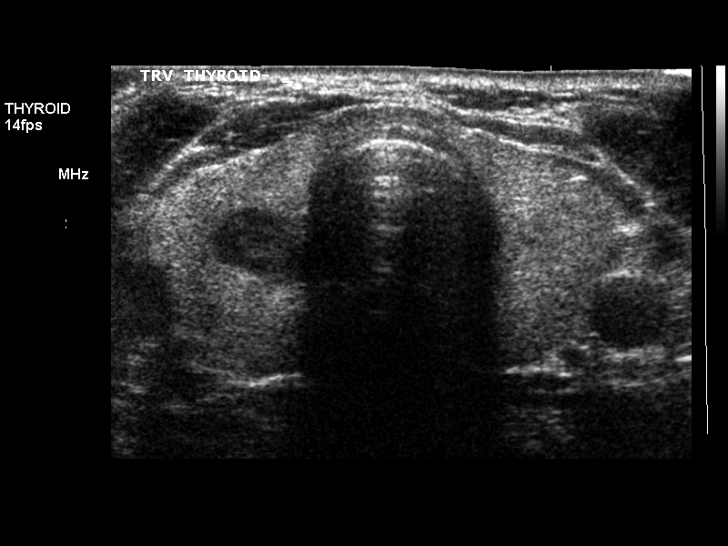
[im 5/51]
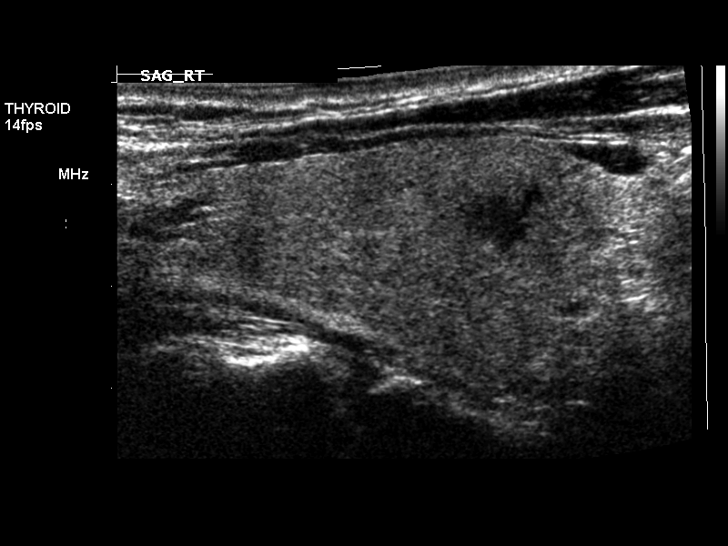
[im 9/51]
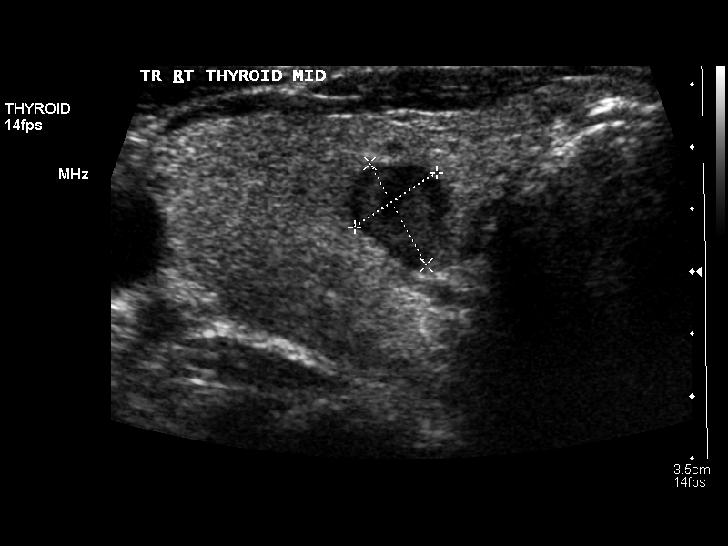
[im 13/51]
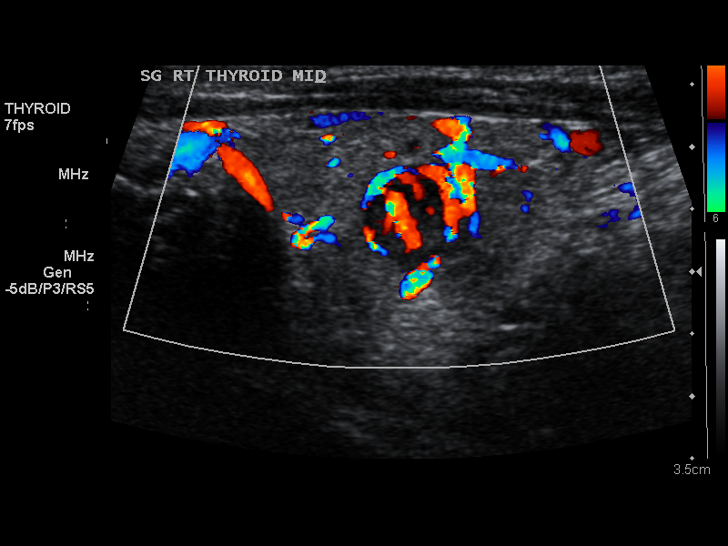
[im 17/51]
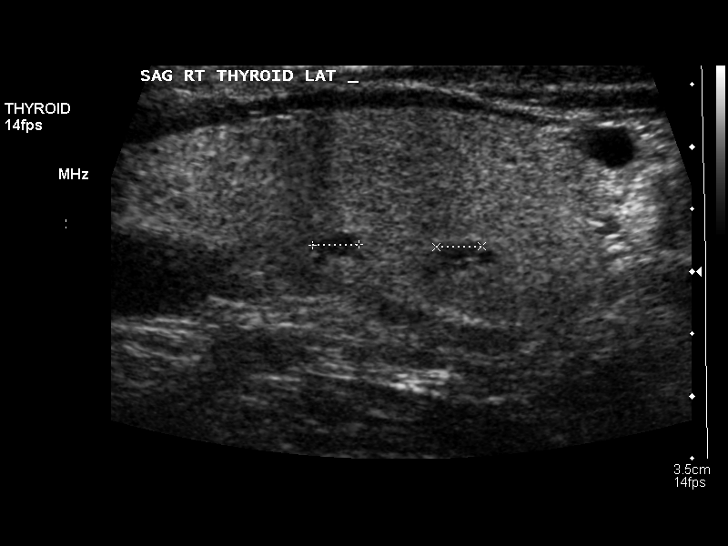
[im 19/51]
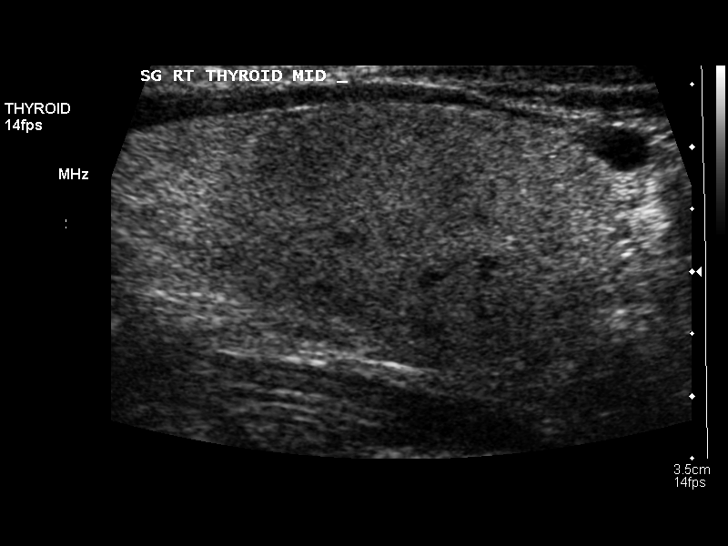
[im 23/51]
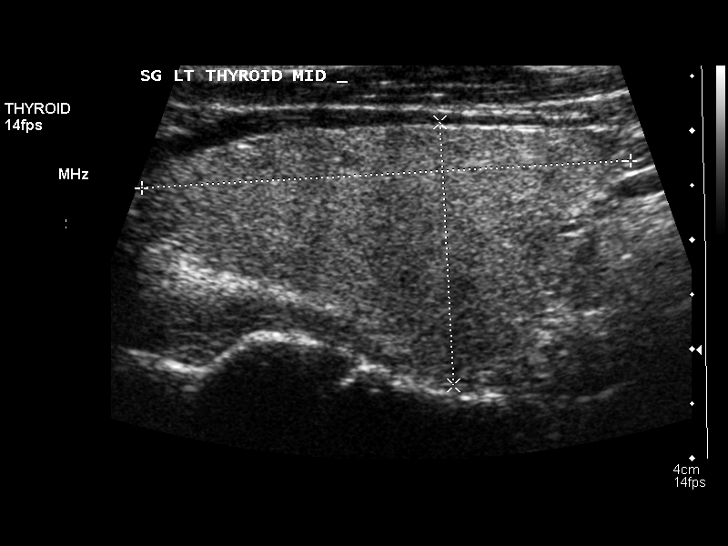
[im 28/51]
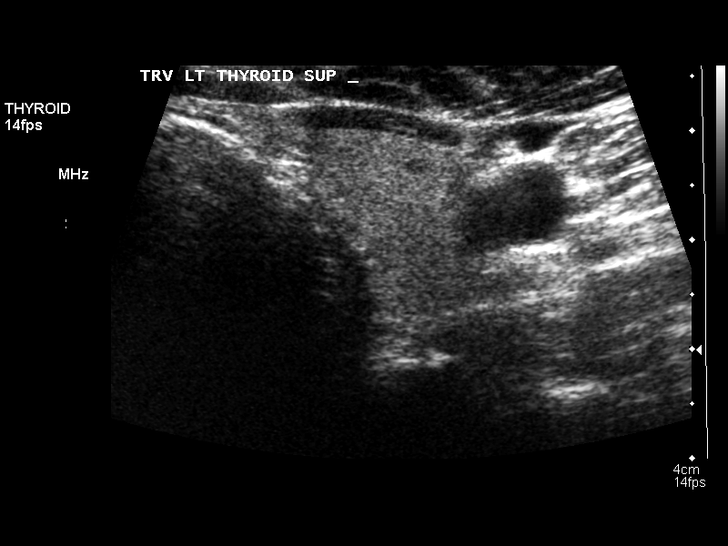
[im 32/51]
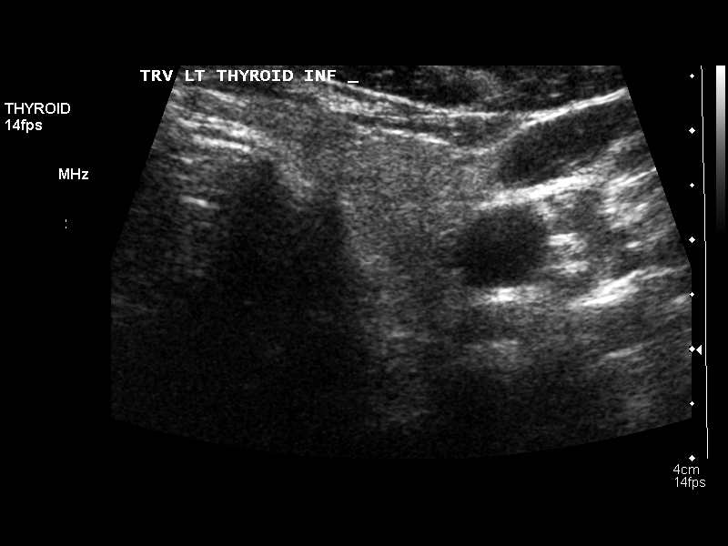
[im 34/51]
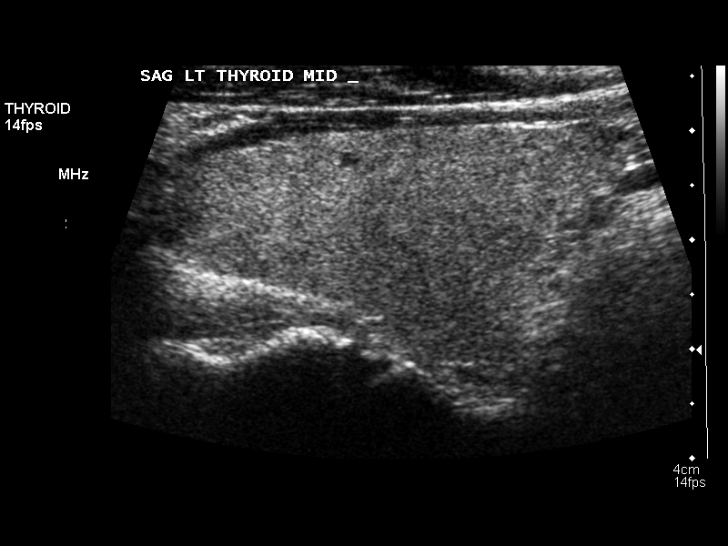
[im 38/51]
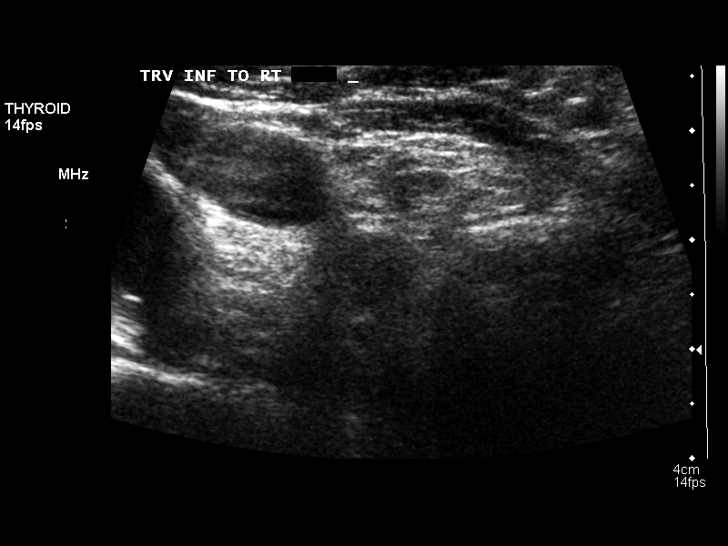
[im 42/51]
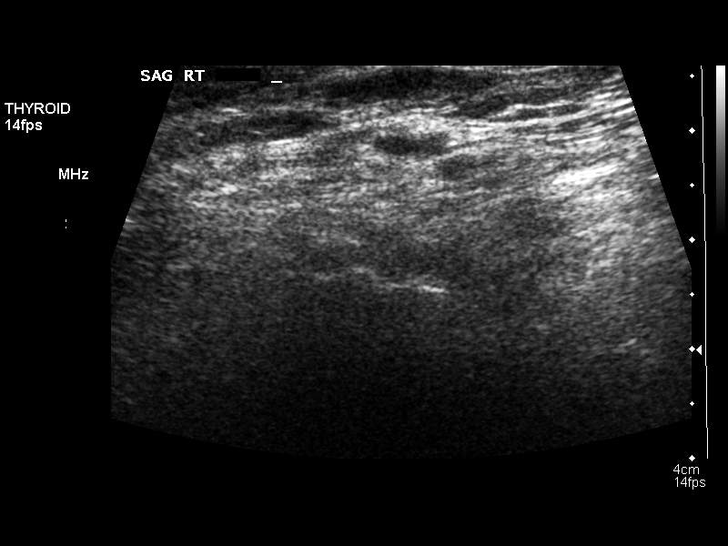
[im 46/51]
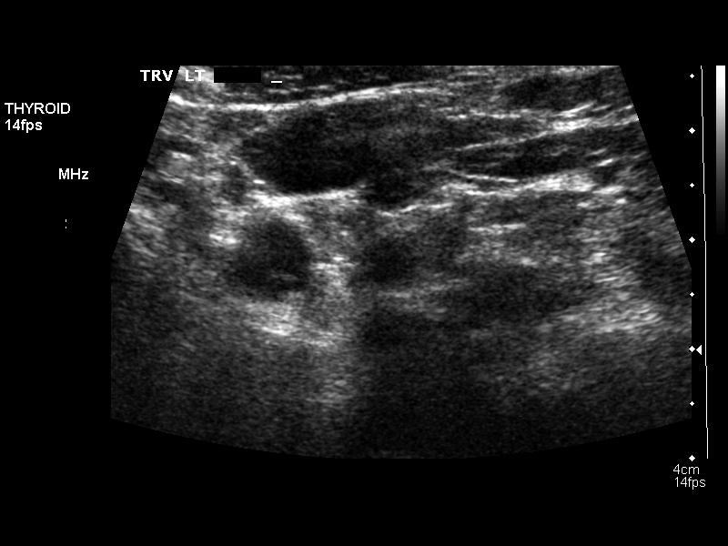
[im 51/51]
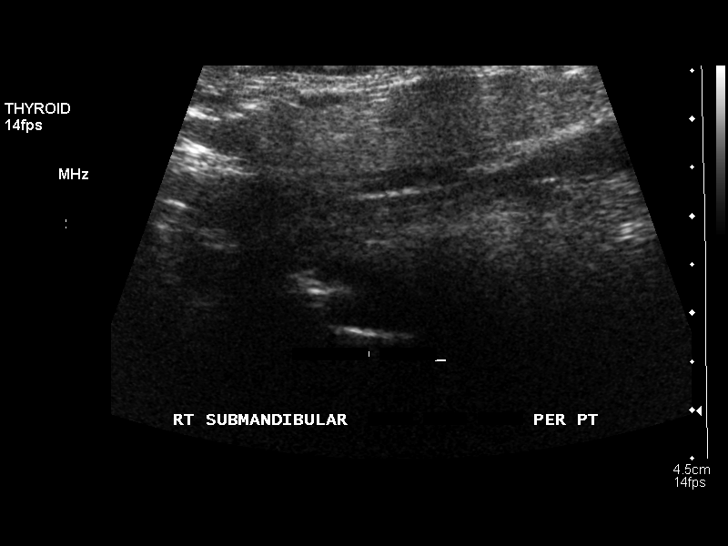

[14 of 25 positions shown; findings below may reference images not displayed]

FINDINGS: Right thyroid lobe

Measurements: 4.6 x 2.6 x 2.1 cm. Stable well-circumscribed
hypoechoic nodule in the midportion of the right lobe measures
cm in greatest diameter. This nodule is noncalcified.

Left thyroid lobe

Measurements: 4.5 x 2.4 x 1.6 cm.. Stable tiny cysts scattered
throughout the left lobe which have a benign appearance.

Isthmus

Thickness: 0.2 cm.  No nodules visualized.

Lymphadenopathy

None visualized.
IMPRESSION: Stable 0.9 cm right thyroid nodule.

## 2014-04-04 ENCOUNTER — Other Ambulatory Visit: Payer: Self-pay

## 2014-05-13 ENCOUNTER — Other Ambulatory Visit: Payer: Self-pay | Admitting: Radiology

## 2014-05-13 ENCOUNTER — Other Ambulatory Visit: Payer: Self-pay | Admitting: Emergency Medicine

## 2014-06-18 ENCOUNTER — Telehealth: Payer: Self-pay | Admitting: Radiology

## 2014-06-18 DIAGNOSIS — E041 Nontoxic single thyroid nodule: Secondary | ICD-10-CM

## 2014-06-18 NOTE — Telephone Encounter (Signed)
-----   Message from Darlyne Russian, MD sent at 06/18/2014  7:58 AM EST ----- Please be sure this gets ordered and the patient is contact ----- Message -----    From: SYSTEM    Sent: 06/18/2014  12:04 AM      To: Darlyne Russian, MD

## 2014-06-18 NOTE — Telephone Encounter (Signed)
Spoke to patient, scan ordered he will follow up with Dr [name] on 1/7, made appt.

## 2014-06-18 NOTE — Telephone Encounter (Signed)
Called patient to advise the [place] of his thyroid is needed. 6 month follow up. Mail box is full. Unable to leave message.

## 2014-06-24 ENCOUNTER — Ambulatory Visit
Admission: RE | Admit: 2014-06-24 | Discharge: 2014-06-24 | Disposition: A | Discharge status: Home / Self Care | Payer: Medicare Other | Source: Ambulatory Visit | Attending: Emergency Medicine | Admitting: Emergency Medicine

## 2014-06-24 DIAGNOSIS — E041 Nontoxic single thyroid nodule: Secondary | ICD-10-CM | POA: Diagnosis not present

## 2014-06-26 ENCOUNTER — Encounter: Payer: Self-pay | Admitting: Emergency Medicine

## 2014-06-26 ENCOUNTER — Ambulatory Visit (INDEPENDENT_AMBULATORY_CARE_PROVIDER_SITE_OTHER): Payer: Medicare Other | Admitting: Emergency Medicine

## 2014-06-26 VITALS — BP 167/67 | HR 54 | Temp 98.0°F | Resp 16 | Ht 69.0 in | Wt 153.0 lb

## 2014-06-26 DIAGNOSIS — Z23 Encounter for immunization: Secondary | ICD-10-CM

## 2014-06-26 MED ORDER — ALPRAZOLAM 0.25 MG PO TABS: ORAL_TABLET | ORAL | Status: DC

## 2014-06-26 NOTE — Progress Notes (Signed)
   Subjective:  This chart was scribed for Darlyne Russian, MD by Lowella Petties, ED Scribe. The patient was seen in room 24. Patient's care was started at 12:16 PM.  Patient ID: Ricardo Livingston, male    DOB: 02-03-1941, 74 y.o.   MRN: [id number]  HPI  HPI Comments: Ricardo Livingston is a 74 y.o. male who presents to the [facility] for a flu shot and Prevnar vaccine. He states that his eating varies, and he has been loosing weight. He sees his urologist every 6 months. He reports noticing bruises that appear intermittently on his arms. He reports occasional edema in his ankles bilaterally. He reports intermittent back pain for which he takes Aspirin with relief. He is requesting a refill of his Xanax and Amlodipine. He reports that he is no longer taking Lexapro because it was not making much of a difference. He reports that he is in the process of moving to the country in the [place] area, and this has raised his spirits.  Review of Systems  Constitutional: Negative for fever, chills and activity change.  HENT: Negative for congestion, ear pain, rhinorrhea, sneezing and sore throat.   Respiratory: Negative for cough, chest tightness, shortness of breath and wheezing.   Cardiovascular: Positive for leg swelling. Negative for chest pain and palpitations.  Gastrointestinal: Negative for nausea, vomiting, abdominal pain, diarrhea and abdominal distention.  Genitourinary: Negative for dysuria, urgency, frequency, hematuria and difficulty urinating.  Musculoskeletal: Positive for back pain. Negative for neck pain.  Skin: Negative for rash and wound.  Neurological: Negative for dizziness, weakness, numbness and headaches.    Objective:  Physical Exam  CONSTITUTIONAL: Well developed/well nourished, Alert, Cooperative HEAD: Normocephalic/atraumatic EYES: EOMI/PERRL ENMT: Mucous membranes moist NECK: supple no meningeal signs SPINE/BACK:entire spine nontender CV: S1/S2 noted, no  murmurs/rubs/gallops noted LUNGS: Lungs are clear to auscultation bilaterally, no apparent distress ABDOMEN: soft, nontender, no rebound or guarding, bowel sounds noted throughout abdomen GU:no cva tenderness NEURO: Pt is awake/alert/appropriate, moves all extremitiesx4.  No facial droop.   EXTREMITIES: pulses normal/equal, full ROM, trace edema in ankles bilateraly SKIN: warm, color normal PSYCH: no abnormalities of mood noted, alert and oriented to situation  Assessment & Plan:  Patient given flu shot today. He will return in one month for his pneumonia shot. He still plans to move to [place] in the spring. I did refill his alprazolam.I personally performed the services described in this documentation, which was scribed in my presence. The recorded information has been reviewed and is accurate.

## 2014-08-26 ENCOUNTER — Ambulatory Visit (INDEPENDENT_AMBULATORY_CARE_PROVIDER_SITE_OTHER): Payer: Medicare Other | Admitting: Physician Assistant

## 2014-08-26 DIAGNOSIS — Z23 Encounter for immunization: Secondary | ICD-10-CM

## 2014-08-26 NOTE — Progress Notes (Signed)
   Subjective:    Patient ID: Ricardo Livingston, male    DOB: 10-25-1940, 74 y.o.   MRN: [id number]  HPI    Review of Systems     Objective:   Physical Exam        Assessment & Plan:  Pt was given the Prevnar 13 today.

## 2014-09-02 DIAGNOSIS — Z8551 Personal history of malignant neoplasm of bladder: Secondary | ICD-10-CM | POA: Diagnosis not present

## 2014-09-02 DIAGNOSIS — C679 Malignant neoplasm of bladder, unspecified: Secondary | ICD-10-CM | POA: Diagnosis not present

## 2014-09-02 DIAGNOSIS — R310 Gross hematuria: Secondary | ICD-10-CM | POA: Diagnosis not present

## 2014-09-02 DIAGNOSIS — C659 Malignant neoplasm of unspecified renal pelvis: Secondary | ICD-10-CM | POA: Diagnosis not present

## 2014-09-02 DIAGNOSIS — R3911 Hesitancy of micturition: Secondary | ICD-10-CM | POA: Diagnosis not present

## 2014-09-02 DIAGNOSIS — N200 Calculus of kidney: Secondary | ICD-10-CM | POA: Diagnosis not present

## 2014-09-17 DIAGNOSIS — Z905 Acquired absence of kidney: Secondary | ICD-10-CM | POA: Diagnosis not present

## 2014-09-17 DIAGNOSIS — N183 Chronic kidney disease, stage 3 (moderate): Secondary | ICD-10-CM | POA: Diagnosis not present

## 2014-09-17 DIAGNOSIS — I10 Essential (primary) hypertension: Secondary | ICD-10-CM | POA: Diagnosis not present

## 2014-09-17 DIAGNOSIS — R634 Abnormal weight loss: Secondary | ICD-10-CM | POA: Diagnosis not present

## 2014-09-26 ENCOUNTER — Telehealth: Payer: Self-pay

## 2014-09-26 DIAGNOSIS — Z1211 Encounter for screening for malignant neoplasm of colon: Secondary | ICD-10-CM

## 2014-09-26 NOTE — Telephone Encounter (Signed)
Received fax from [facility], from Dr. Florene Glen. Note made that it had been a while since last Colonoscopy. Per Dr. [name], called pt and he said it had been more than 10 yrs. Sent referral for one. Dr. Abel Presto note sent to be scan.

## 2014-10-22 IMAGING — US US SOFT TISSUE HEAD/NECK
1 series · 14 of 25 positions shown · non-contrast
Comparison: 04/10/2013 and earlier studies

CLINICAL DATA: Thyroid nodule

EXAM:
THYROID ULTRASOUND
TECHNIQUE: Ultrasound examination of the thyroid gland and adjacent soft
tissues was performed.

[Series 1: us soft tissue head/neck · 0.13mm/px · 14 of 45 slices shown]
[im 1/45]
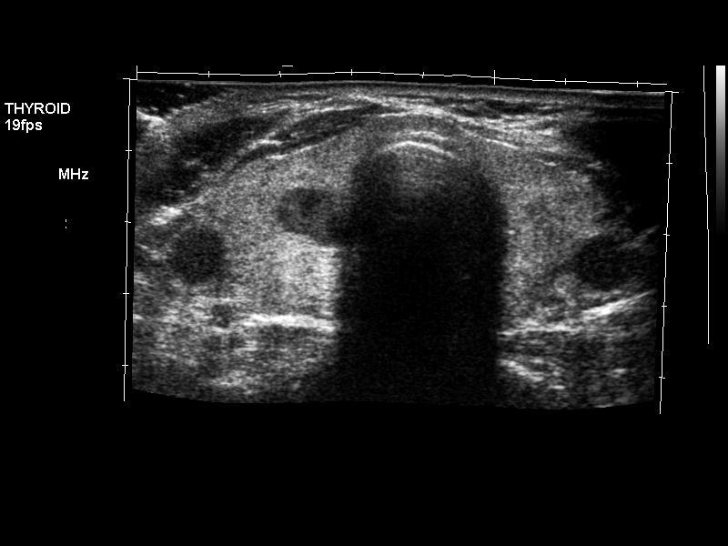
[im 4/45]
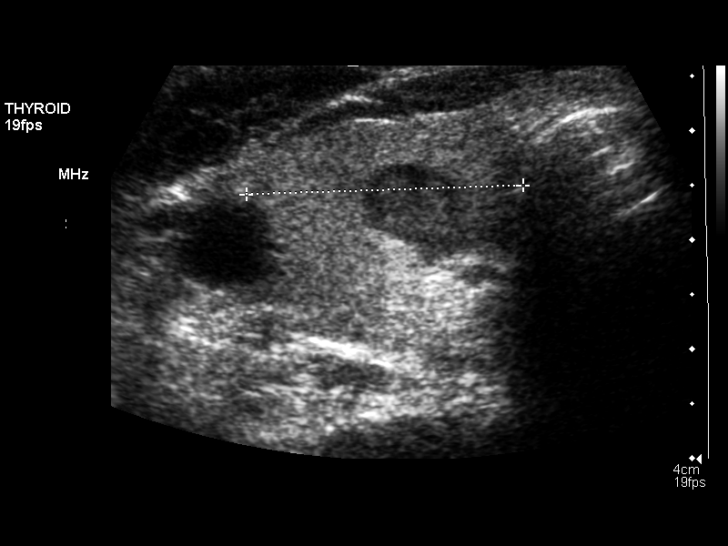
[im 8/45]
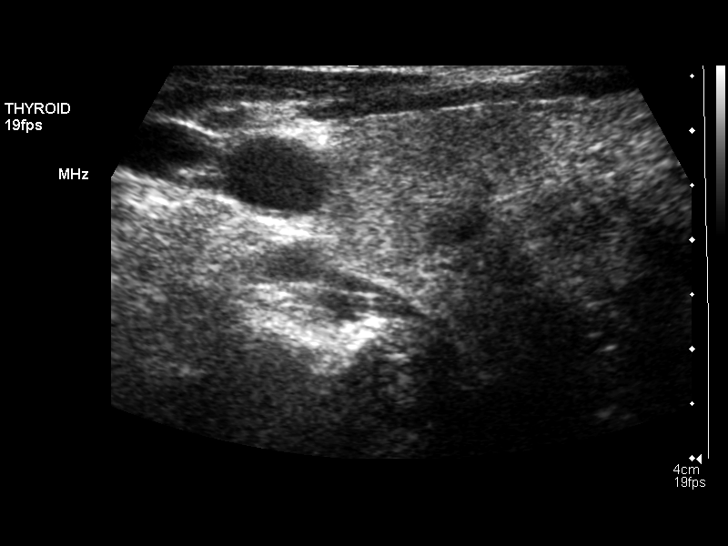
[im 12/45]
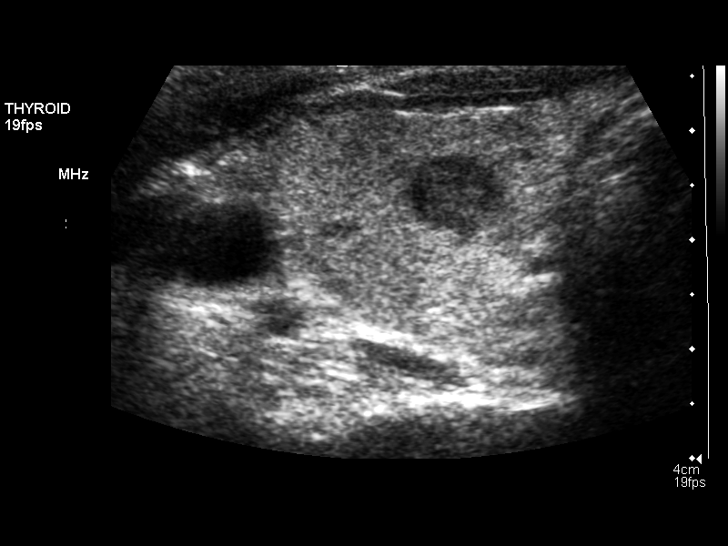
[im 15/45]
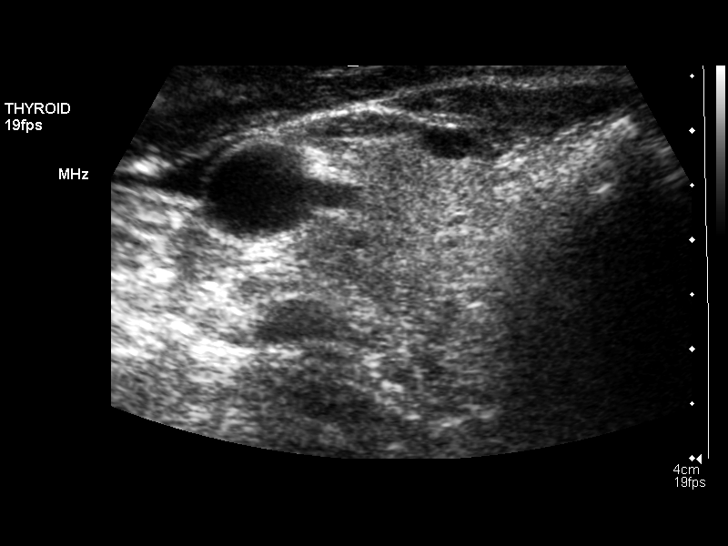
[im 17/45]
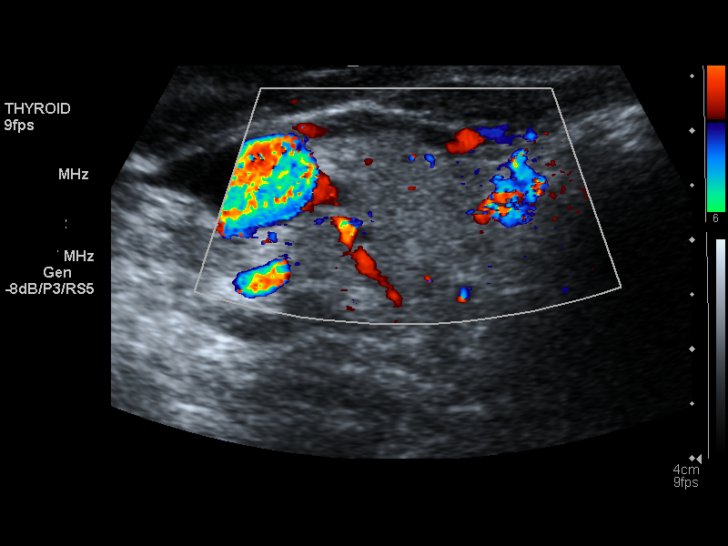
[im 21/45]
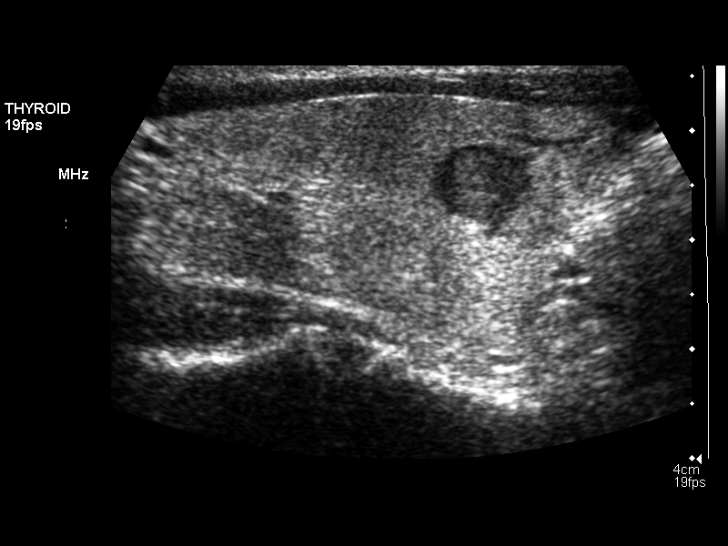
[im 24/45]
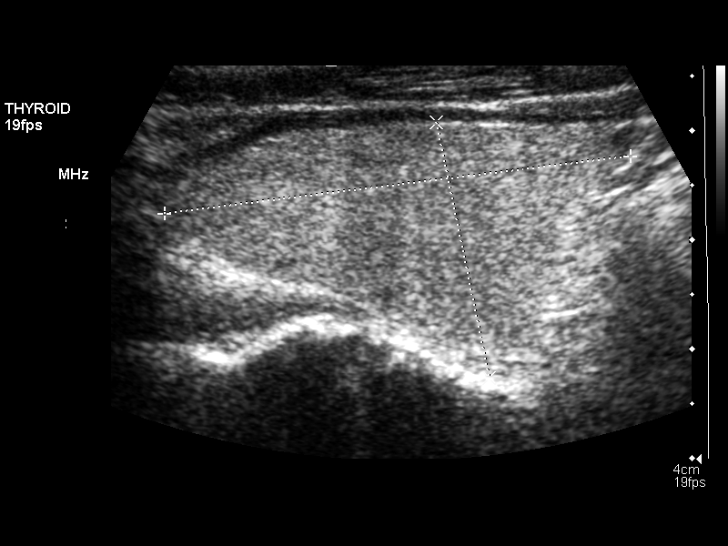
[im 28/45]
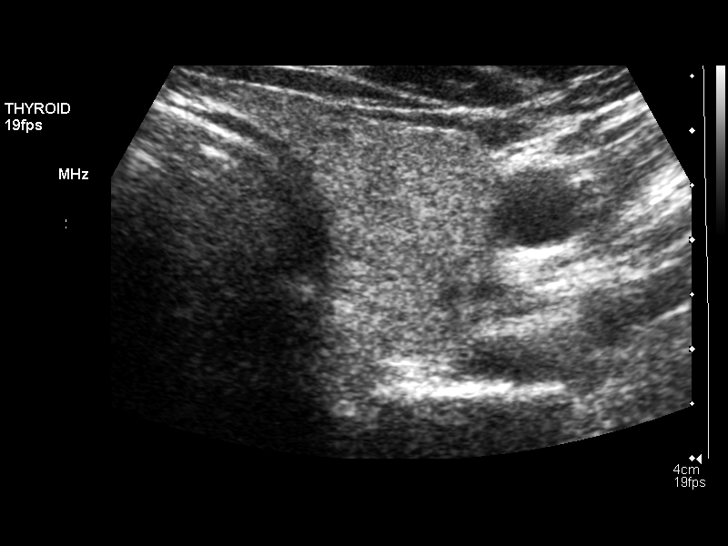
[im 30/45]
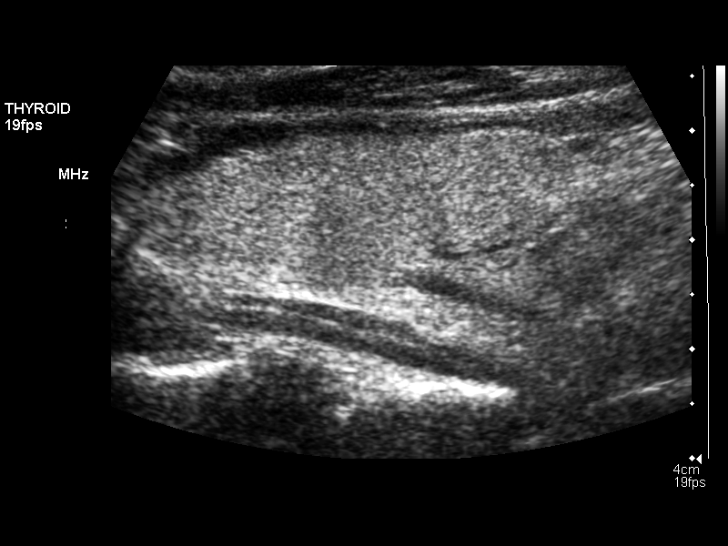
[im 34/45]
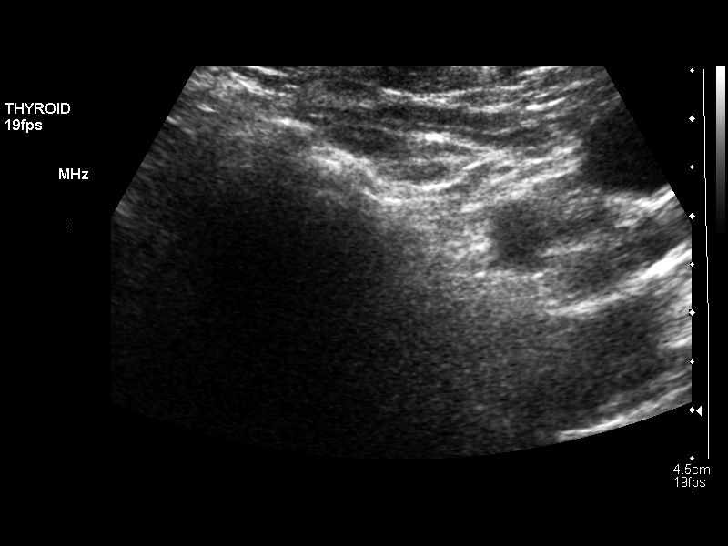
[im 37/45]
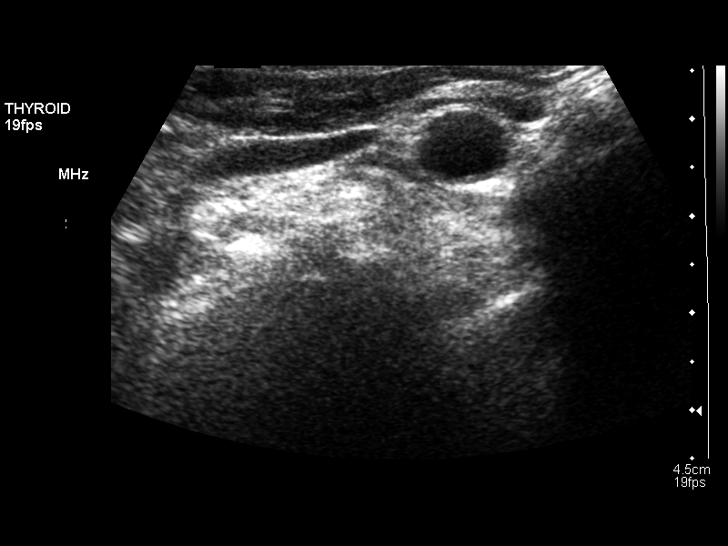
[im 41/45]
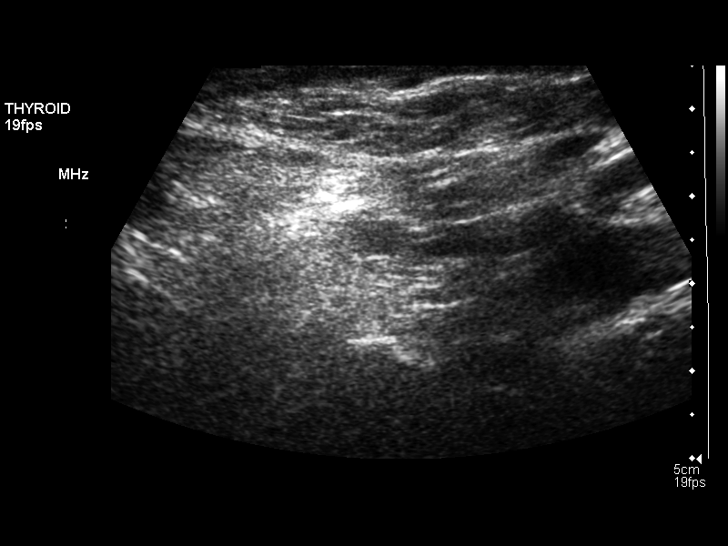
[im 45/45]
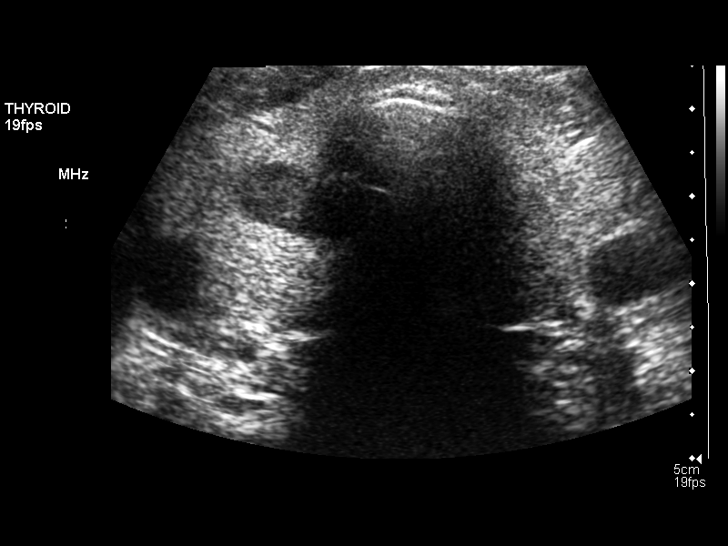

[14 of 25 positions shown; findings below may reference images not displayed]

FINDINGS: Right thyroid lobe

Measurements: 49 x 20 x 25 mm. Mild hyperemia. 9mm hypoechoic mid
lobe nodule (previously 9 mm). Several small hypoechoic/ cystic
lesions all less than 4 mm.

Left thyroid lobe

Measurements: 43 x 24 x 18 mm.  No nodules visualized.

Isthmus

Thickness: 2.7 mm.  No nodules visualized.

Lymphadenopathy

None visualized.
IMPRESSION: 1. Stable dominant 9 mm right thyroid nodule. Findings do not meet
current consensus criteria for biopsy. Follow-up by clinical exam is
recommended. If patient has known risk factors for thyroid
carcinoma, consider follow-up ultrasound in 12 months. If patient is
clinically hyperthyroid, consider nuclear medicine thyroid uptake
and scan. This recommendation follows the consensus statement:
Management of Thyroid Nodules Detected as US: Society of
Radiologists in Ultrasound Consensus Conference Statement. Radiology

## 2014-12-17 ENCOUNTER — Ambulatory Visit (INDEPENDENT_AMBULATORY_CARE_PROVIDER_SITE_OTHER): Payer: Medicare Other | Admitting: Family Medicine

## 2014-12-17 VITALS — BP 132/64 | HR 57 | Temp 97.7°F | Resp 16 | Ht 69.0 in | Wt 152.4 lb

## 2014-12-17 DIAGNOSIS — W57XXXA Bitten or stung by nonvenomous insect and other nonvenomous arthropods, initial encounter: Secondary | ICD-10-CM

## 2014-12-17 DIAGNOSIS — R5383 Other fatigue: Secondary | ICD-10-CM | POA: Diagnosis not present

## 2014-12-17 DIAGNOSIS — R21 Rash and other nonspecific skin eruption: Secondary | ICD-10-CM | POA: Diagnosis not present

## 2014-12-17 DIAGNOSIS — D2272 Melanocytic nevi of left lower limb, including hip: Secondary | ICD-10-CM

## 2014-12-17 DIAGNOSIS — T148 Other injury of unspecified body region: Secondary | ICD-10-CM

## 2014-12-17 DIAGNOSIS — Z8639 Personal history of other endocrine, nutritional and metabolic disease: Secondary | ICD-10-CM | POA: Diagnosis not present

## 2014-12-17 DIAGNOSIS — M7989 Other specified soft tissue disorders: Secondary | ICD-10-CM | POA: Diagnosis not present

## 2014-12-17 LAB — POCT CBC
Granulocyte percent: 55.9 %G (ref 37–80)
HCT, POC: 43.6 % (ref 43.5–53.7)
Hemoglobin: 14.1 g/dL (ref 14.1–18.1)
Lymph, poc: 2.6 (ref 0.6–3.4)
MCH, POC: 29.9 pg (ref 27–31.2)
MCHC: 32.3 g/dL (ref 31.8–35.4)
MCV: 92.6 fL (ref 80–97)
MID (CBC): 0.6 (ref 0–0.9)
MPV: 8.2 fL (ref 0–99.8)
PLATELET COUNT, POC: 194 10*3/uL (ref 142–424)
POC Granulocyte: 4.1 (ref 2–6.9)
POC LYMPH PERCENT: 35.8 %L (ref 10–50)
POC MID %: 8.3 % (ref 0–12)
RBC: 4.71 M/uL (ref 4.69–6.13)
RDW, POC: 13.4 %
WBC: 7.3 10*3/uL (ref 4.6–10.2)

## 2014-12-17 MED ORDER — TRIAMCINOLONE ACETONIDE 0.1 % EX CREA: 1.0000 "application " | TOPICAL_CREAM | Freq: Three times a day (TID) | CUTANEOUS | Status: AC | PRN

## 2014-12-17 NOTE — Patient Instructions (Addendum)
Keep follow-up with her dermatologist for the lesion on your left foot as well as discussing rash you get on your scalp after sweating. The rash on her legs appears to be from some type of bug bite with scratching causing the redness and scabs.  You can use the steroid cream up to 3 times per day to be itching areas, but if this does not allow the rash to improve within the next week, return for recheck with myself or Dr. [name]. Return to the clinic or go to the nearest emergency room if any of your symptoms worsen or new symptoms occur.  I will check a thyroid tests for your fatigue but as this has been going on for some time I would like you to follow-up with Dr. [name] to discuss this further. Your blood count was normal here in the office tonight so does not appear anemia is a cause of your symptoms.  If you have any new chest pains, shortness of breath, dizziness,  lightheadedness or acute change in your symptoms, return to the clinic or nearest emergency room.  For the right lower leg swelling, I will have the staff schedule a ultrasound to look for blood clot, but this is less likely. If the ultrasound is negative you may need to see a vascular specialist.  He can also discuss this with Dr. [name] in follow-up.     Peripheral Edema You have swelling in your legs (peripheral edema). This swelling is due to excess accumulation of salt and water in your body. Edema may be a sign of heart, kidney or liver disease, or a side effect of a medication. It may also be due to problems in the leg veins. Elevating your legs and using special support stockings may be very helpful, if the cause of the swelling is due to poor venous circulation. Avoid long periods of standing, whatever the cause. Treatment of edema depends on identifying the cause. Chips, pretzels, pickles and other salty foods should be avoided. Restricting salt in your diet is almost always needed. Water pills (diuretics) are often used to remove the  excess salt and water from your body via urine. These medicines prevent the kidney from reabsorbing sodium. This increases urine flow. Diuretic treatment may also result in lowering of potassium levels in your body. Potassium supplements may be needed if you have to use diuretics daily. Daily weights can help you keep track of your progress in clearing your edema. You should call your caregiver for follow up care as recommended. SEEK IMMEDIATE MEDICAL CARE IF:   You have increased swelling, pain, redness, or heat in your legs.  You develop shortness of breath, especially when lying down.  You develop chest or abdominal pain, weakness, or fainting.  You have a fever. Document Released: 07/14/2004 Document Revised: 08/29/2011 Document Reviewed: 06/24/2009 [facility] Patient Information 2015 [place], [place]. This information is not intended to replace advice given to you by your health care provider. Make sure you discuss any questions you have with your health care provider.   Insect Bite Mosquitoes, flies, fleas, bedbugs, and many other insects can bite. Insect bites are different from insect stings. A sting is when venom is injected into the skin. Some insect bites can transmit infectious diseases. SYMPTOMS  Insect bites usually turn red, swell, and itch for 2 to 4 days. They often go away on their own. TREATMENT  Your caregiver may prescribe antibiotic medicines if a bacterial infection develops in the bite. HOME CARE INSTRUCTIONS  Do not scratch the bite area.  Keep the bite area clean and dry. Wash the bite area thoroughly with soap and water.  Put ice or cool compresses on the bite area.  Put ice in a plastic bag.  Place a towel between your skin and the bag.  Leave the ice on for 20 minutes, 4 times a day for the first 2 to 3 days, or as directed.  You may apply a baking soda paste, cortisone cream, or calamine lotion to the bite area as directed by your caregiver. This can help  reduce itching and swelling.  Only take over-the-counter or prescription medicines as directed by your caregiver.  If you are given antibiotics, take them as directed. Finish them even if you start to feel better. You may need a tetanus shot if:  You cannot remember when you had your last tetanus shot.  You have never had a tetanus shot.  The injury broke your skin. If you get a tetanus shot, your arm may swell, get red, and feel warm to the touch. This is common and not a problem. If you need a tetanus shot and you choose not to have one, there is a rare chance of getting tetanus. Sickness from tetanus can be serious. SEEK IMMEDIATE MEDICAL CARE IF:   You have increased pain, redness, or swelling in the bite area.  You see a red line on the skin coming from the bite.  You have a fever.  You have joint pain.  You have a headache or neck pain.  You have unusual weakness.  You have a rash.  You have chest pain or shortness of breath.  You have abdominal pain, nausea, or vomiting.  You feel unusually tired or sleepy. MAKE SURE YOU:   Understand these instructions.  Will watch your condition.  Will get help right away if you are not doing well or get worse. Document Released: 07/14/2004 Document Revised: 08/29/2011 Document Reviewed: 01/05/2011 [facility] Patient Information 2015 [place], [place]. This information is not intended to replace advice given to you by your health care provider. Make sure you discuss any questions you have with your health care provider. Rash A rash is a change in the color or texture of your skin. There are many different types of rashes. You may have other problems that accompany your rash. CAUSES   Infections.  Allergic reactions. This can include allergies to pets or foods.  Certain medicines.  Exposure to certain chemicals, soaps, or cosmetics.  Heat.  Exposure to poisonous plants.  Tumors, both cancerous and noncancerous. SYMPTOMS    Redness.  Scaly skin.  Itchy skin.  Dry or cracked skin.  Bumps.  Blisters.  Pain. DIAGNOSIS  Your caregiver may do a physical exam to determine what type of rash you have. A skin sample (biopsy) may be taken and examined under a microscope. TREATMENT  Treatment depends on the type of rash you have. Your caregiver may prescribe certain medicines. For serious conditions, you may need to see a skin doctor (dermatologist). HOME CARE INSTRUCTIONS   Avoid the substance that caused your rash.  Do not scratch your rash. This can cause infection.  You may take cool baths to help stop itching.  Only take over-the-counter or prescription medicines as directed by your caregiver.  Keep all follow-up appointments as directed by your caregiver. SEEK IMMEDIATE MEDICAL CARE IF:  You have increasing pain, swelling, or redness.  You have a fever.  You have new or severe symptoms.  You have body aches, diarrhea, or vomiting.  Your rash is not better after 3 days. MAKE SURE YOU:  Understand these instructions.  Will watch your condition.  Will get help right away if you are not doing well or get worse. Document Released: 05/27/2002 Document Revised: 08/29/2011 Document Reviewed: 03/21/2011 [facility] Patient Information 2015 [place], [place]. This information is not intended to replace advice given to you by your health care provider. Make sure you discuss any questions you have with your health care provider.

## 2014-12-17 NOTE — Progress Notes (Signed)
Subjective:    Patient ID: Ricardo Livingston, male    DOB: 06/10/1941, 74 y.o.   MRN: [id number] This chart was scribed for [name], MD by Marti Sleigh, Medical Scribe. This patient was seen in Room 14 and the patient's care was started a 5:32 PM.  Chief Complaint  Patient presents with  . Foot Swelling    bilat ankle  . Rash    HPI HPI Comments: Ricardo Livingston is a 74 y.o. male who presents to [facility] complaining of bilateral leg swelling, welts with itching on bilateral legs, bumps on bilateral hands, a sweat induced rash on his face and a rash on his right foot. Pt states the welts on legs started ten days ago, and states they are itchy. Pt states he has been working outside more for the last month though he is not aware of being bitten by any bugs. Pt states he has a hx of skin issues. Pt denies seeing welts on his back, chest or face. Pt states he has also had bumps his hands and arms for the last two months, but these have not erupted. Pt also states he has a rash on the bottom of his left foot, which was initially pink and has become darker over the last month with some crusting recently.  Pt's dermatologist is Dr. [name], and he has an appointment set to see the Dr. on July 13th. Pt states his last appointment with Dr. [name] was one year ago, and at that visit he had lesions removed from face and ears. Pt denies fever, chills.  Pt also complains of bilateral ankle and leg swelling, worse for the last month. Pt denies ankle or knee injury. Pt states he has also noticed increased fatigue over the last several months. Pt states it reminds him of when he was young when he was treated for anemia.   From last note: Pt has a past hx of HTN, depression, and bladder cancer (followed by urology every six months), renal insufficiency (followed by nephrology), thyroid nodule that was stable as of January 5th with small bilateral thyroid nodules less than 21mm.  Last TSH test was in January 2014,  which was normal at 2.9. Pt reported intermittent ankle swelling at January visit with Dr. [name], his PCP. At that visit there was trace edema appreciated. Pt's weight remains stable, at last visit 153 and today at 152.    Marland Kitchen Patient Active Problem List   Diagnosis Date Noted  . Inguinal hernia, right, reducible 07/29/2013  . Right thyroid nodule 07/16/2013  . Bladder cancer 07/04/2012  . Depression   . Elevated blood pressure   . CAROTID BRUIT 07/13/2010   Past Medical History  Diagnosis Date  . Urothelial cancer   . History of nephroureterectomy     left kidney  . Depression   . Hernia     left   . Nicotine addiction     pipe  . Bladder cancer   . Renal cancer   . Elevated blood pressure   . Anxiety   . Hypertension   . Allergy   . Anemia    Past Surgical History  Procedure Laterality Date  . Removal of kidney  2011    left  . Bladder surgery      bladder cancer removed  . Stomach surgery      stomach surg as infant  . Lithotripsy    . Inguinal hernia repair  04/29/2011    Procedure: HERNIA  REPAIR INGUINAL ADULT;  Surgeon: [name], MD;  Location: [facility];  Service: General;  Laterality: Left;  with mesh  . Hernia repair      LiH   No Known Allergies Prior to Admission medications   Medication Sig Start Date End Date Taking? Authorizing Provider  ALPRAZolam Duanne Moron) 0.25 MG tablet TAKE 1/2 TO 1 TABLET AS NEEDED FOR STRESS. 06/26/14  Yes Darlyne Russian, MD  aspirin 81 MG tablet Take 81 mg by mouth daily.     Yes Historical Provider, MD  diphenhydrAMINE (BENADRYL) 25 mg capsule Take 25 mg by mouth every 6 (six) hours as needed. For allergies    Yes Historical Provider, MD  Multiple Vitamins-Minerals (MULTIVITAMINS THER. W/MINERALS) TABS Take 1 tablet by mouth daily. Does not take on a regular basis. Takes when remembers.    Yes Historical Provider, MD  amLODipine (NORVASC) 5 MG tablet Take 1 tablet (5 mg total) by mouth daily. 01/24/12 07/29/13  Darlyne Russian, MD    escitalopram (LEXAPRO) 10 MG tablet Take 1 tablet (10 mg total) by mouth daily. Patient not taking: Reported on 12/17/2014 07/23/13   Darlyne Russian, MD   History   Social History  . Marital Status: Widowed    Spouse Name: [name]  . Number of Children: [name]  . Years of Education: [name]   Occupational History  . Not on file.   Social History Main Topics  . Smoking status: Current Every Day Smoker    Types: Pipe    Last Attempt to Quit: 03/24/1999  . Smokeless tobacco: Current User     Comment: puffs on a pipe  . Alcohol Use: Yes     Comment: 2-3 drinks in evening  . Drug Use: No  . Sexual Activity: Not on file   Other Topics Concern  . Not on file   Social History Narrative    Review of Systems  Constitutional: Positive for fatigue. Negative for fever and chills.  Cardiovascular: Positive for leg swelling. Negative for chest pain.  Skin: Positive for color change, rash and wound.       Objective:   Physical Exam  Constitutional: He is oriented to person, place, and time. He appears well-developed and well-nourished. No distress.  HENT:  Head: Normocephalic and atraumatic.  Eyes: Pupils are equal, round, and reactive to light.  Neck: Neck supple. No thyromegaly present.  No palpable nodules in thyroid.  Cardiovascular: Normal rate, regular rhythm and normal heart sounds.   Trace nonpitting edema in the left ankle up 3-4cm up from the ankle mortis. Right ankle and foot 1+ edema, which is more edematous up to the mid calf. DP pulse 2+ in left foot. Some difficulty obtaining pulse in right foot.   Pulmonary/Chest: Effort normal and breath sounds normal. No respiratory distress. He has no wheezes. He has no rales.  Musculoskeletal: Normal range of motion. He exhibits edema. He exhibits no tenderness.  Calf non tender, negative homan's. Slight bluish hue in right great toe and pinky toe, cap refill 2+ in right great toe. Cap refill 1-2 seconds on right pinky toe.  Neurological: He  is alert and oriented to person, place, and time. Coordination normal.  Skin: Skin is warm and dry. He is not diaphoretic.  Left leg 9 erythematous patches with central excoriation, roughly 1/1cm, with some bruising on the uppermost lesion. No surrouding erythma. Right leg has tree similar lesions. Small patch of erythema on right inner calf, not indurated, minimal blanching with  pressure. 1/1cm red leason on left foot.   Psychiatric: He has a normal mood and affect. His behavior is normal.  Nursing note and vitals reviewed.  Results for orders placed or performed in visit on 12/17/14  POCT CBC  Result Value Ref Range   WBC 7.3 4.6 - 10.2 K/uL   Lymph, poc 2.6 0.6 - 3.4   POC LYMPH PERCENT 35.8 10 - 50 %L   MID (cbc) 0.6 0 - 0.9   POC MID % 8.3 0 - 12 %M   POC Granulocyte 4.1 2 - 6.9   Granulocyte percent 55.9 37 - 80 %G   RBC 4.71 4.69 - 6.13 M/uL   Hemoglobin 14.1 14.1 - 18.1 g/dL   HCT, POC 43.6 43.5 - 53.7 %   MCV 92.6 80 - 97 fL   MCH, POC 29.9 27 - 31.2 pg   MCHC 32.3 31.8 - 35.4 g/dL   RDW, POC 13.4 %   Platelet Count, POC 194 142 - 424 K/uL   MPV 8.2 0 - 99.8 fL        Assessment & Plan:   ASHETON SCHEFFLER is a 74 y.o. male Right leg swelling - Plan: US Venous Img Lower Unilateral Right  -He appears to have some component of peripheral edema by prior notes and his report his right lower extremity appears to be more swollen over these past few months. Less likely DVT but will check ultrasound.  Some slight change in hue with his stenosis which may be due to the cold room but also possible peripheral vascular disease. May need further workup with vascular, but will start with ultrasound and plan on following up in the next 10 days with his primary care provider or me if needed.  Other fatigue - Plan: TSH, POCT CBC  CBC okay, TSH pending, plan to follow-up with Dr. [name] to discuss this symptom as has been long-standing.  As in AVS - if  he has any chest pain,  shortness of  breath, or new/worsening symptoms should return to clinic or emergency room.  Rash and nonspecific skin eruption, Insect bite - Plan: triamcinolone cream (KENALOG) 0.1 %  Suspected insect bites or other contact dermatitis as affecting lower legs, with secondary excoriation. Trial of triamcinolone topical 3 times a day when necessary,  then recheck if not improving in the next week to 10 days. Sooner if worse.  Nevus of foot, left  Plantar aspect of left foot, with change in appearance recently. This lesion does not appear to be infection and appears different than rash on lower extremities. He has follow-up in the next 2 weeks with dermatology.  Advised to discuss that area with them as well as his intermittent rash in areas where he sweats on his scalp and neck.  History of thyroid nodule - Plan: TSH   Meds ordered this encounter  Medications  . triamcinolone cream (KENALOG) 0.1 %    Sig: Apply 1 application topically 3 (three) times daily as needed. To affected areas only.    Dispense:  30 g    Refill:  0   Patient Instructions  Keep follow-up with her dermatologist for the lesion on your left foot as well as discussing rash you get on your scalp after sweating. The rash on her legs appears to be from some type of bug bite with scratching causing the redness and scabs.  You can use the steroid cream up to 3 times per day to be itching areas, but  if this does not allow the rash to improve within the next week, return for recheck with myself or Dr. [name]. Return to the clinic or go to the nearest emergency room if any of your symptoms worsen or new symptoms occur.  I will check a thyroid tests for your fatigue but as this has been going on for some time I would like you to follow-up with Dr. [name] to discuss this further. Your blood count was normal here in the office tonight so does not appear anemia is a cause of your symptoms.  If you have any new chest pains, shortness of breath, dizziness,   lightheadedness or acute change in your symptoms, return to the clinic or nearest emergency room.  For the right lower leg swelling, I will have the staff schedule a ultrasound to look for blood clot, but this is less likely. If the ultrasound is negative you may need to see a vascular specialist.  He can also discuss this with Dr. [name] in follow-up.     Peripheral Edema You have swelling in your legs (peripheral edema). This swelling is due to excess accumulation of salt and water in your body. Edema may be a sign of heart, kidney or liver disease, or a side effect of a medication. It may also be due to problems in the leg veins. Elevating your legs and using special support stockings may be very helpful, if the cause of the swelling is due to poor venous circulation. Avoid long periods of standing, whatever the cause. Treatment of edema depends on identifying the cause. Chips, pretzels, pickles and other salty foods should be avoided. Restricting salt in your diet is almost always needed. Water pills (diuretics) are often used to remove the excess salt and water from your body via urine. These medicines prevent the kidney from reabsorbing sodium. This increases urine flow. Diuretic treatment may also result in lowering of potassium levels in your body. Potassium supplements may be needed if you have to use diuretics daily. Daily weights can help you keep track of your progress in clearing your edema. You should call your caregiver for follow up care as recommended. SEEK IMMEDIATE MEDICAL CARE IF:   You have increased swelling, pain, redness, or heat in your legs.  You develop shortness of breath, especially when lying down.  You develop chest or abdominal pain, weakness, or fainting.  You have a fever. Document Released: 07/14/2004 Document Revised: 08/29/2011 Document Reviewed: 06/24/2009 [facility] Patient Information 2015 [place], [place]. This information is not intended to replace advice given  to you by your health care provider. Make sure you discuss any questions you have with your health care provider.   Insect Bite Mosquitoes, flies, fleas, bedbugs, and many other insects can bite. Insect bites are different from insect stings. A sting is when venom is injected into the skin. Some insect bites can transmit infectious diseases. SYMPTOMS  Insect bites usually turn red, swell, and itch for 2 to 4 days. They often go away on their own. TREATMENT  Your caregiver may prescribe antibiotic medicines if a bacterial infection develops in the bite. HOME CARE INSTRUCTIONS  Do not scratch the bite area.  Keep the bite area clean and dry. Wash the bite area thoroughly with soap and water.  Put ice or cool compresses on the bite area.  Put ice in a plastic bag.  Place a towel between your skin and the bag.  Leave the ice on for 20 minutes, 4 times a day  for the first 2 to 3 days, or as directed.  You may apply a baking soda paste, cortisone cream, or calamine lotion to the bite area as directed by your caregiver. This can help reduce itching and swelling.  Only take over-the-counter or prescription medicines as directed by your caregiver.  If you are given antibiotics, take them as directed. Finish them even if you start to feel better. You may need a tetanus shot if:  You cannot remember when you had your last tetanus shot.  You have never had a tetanus shot.  The injury broke your skin. If you get a tetanus shot, your arm may swell, get red, and feel warm to the touch. This is common and not a problem. If you need a tetanus shot and you choose not to have one, there is a rare chance of getting tetanus. Sickness from tetanus can be serious. SEEK IMMEDIATE MEDICAL CARE IF:   You have increased pain, redness, or swelling in the bite area.  You see a red line on the skin coming from the bite.  You have a fever.  You have joint pain.  You have a headache or neck  pain.  You have unusual weakness.  You have a rash.  You have chest pain or shortness of breath.  You have abdominal pain, nausea, or vomiting.  You feel unusually tired or sleepy. MAKE SURE YOU:   Understand these instructions.  Will watch your condition.  Will get help right away if you are not doing well or get worse. Document Released: 07/14/2004 Document Revised: 08/29/2011 Document Reviewed: 01/05/2011 [facility] Patient Information 2015 [place], [place]. This information is not intended to replace advice given to you by your health care provider. Make sure you discuss any questions you have with your health care provider. Rash A rash is a change in the color or texture of your skin. There are many different types of rashes. You may have other problems that accompany your rash. CAUSES   Infections.  Allergic reactions. This can include allergies to pets or foods.  Certain medicines.  Exposure to certain chemicals, soaps, or cosmetics.  Heat.  Exposure to poisonous plants.  Tumors, both cancerous and noncancerous. SYMPTOMS   Redness.  Scaly skin.  Itchy skin.  Dry or cracked skin.  Bumps.  Blisters.  Pain. DIAGNOSIS  Your caregiver may do a physical exam to determine what type of rash you have. A skin sample (biopsy) may be taken and examined under a microscope. TREATMENT  Treatment depends on the type of rash you have. Your caregiver may prescribe certain medicines. For serious conditions, you may need to see a skin doctor (dermatologist). HOME CARE INSTRUCTIONS   Avoid the substance that caused your rash.  Do not scratch your rash. This can cause infection.  You may take cool baths to help stop itching.  Only take over-the-counter or prescription medicines as directed by your caregiver.  Keep all follow-up appointments as directed by your caregiver. SEEK IMMEDIATE MEDICAL CARE IF:  You have increasing pain, swelling, or redness.  You have a  fever.  You have new or severe symptoms.  You have body aches, diarrhea, or vomiting.  Your rash is not better after 3 days. MAKE SURE YOU:  Understand these instructions.  Will watch your condition.  Will get help right away if you are not doing well or get worse. Document Released: 05/27/2002 Document Revised: 08/29/2011 Document Reviewed: 03/21/2011 [facility] Patient Information 2015 [place], [place]. This information is  not intended to replace advice given to you by your health care provider. Make sure you discuss any questions you have with your health care provider.     I personally performed the services described in this documentation, which was scribed in my presence. The recorded information has been reviewed and considered, and addended by me as needed.

## 2014-12-18 LAB — TSH: TSH: 2.077 u[IU]/mL (ref 0.350–4.500)

## 2014-12-30 ENCOUNTER — Other Ambulatory Visit: Payer: Self-pay | Admitting: Emergency Medicine

## 2014-12-30 ENCOUNTER — Telehealth: Payer: Self-pay

## 2014-12-30 MED ORDER — ALPRAZOLAM 0.25 MG PO TABS: ORAL_TABLET | ORAL | Status: DC

## 2014-12-30 NOTE — Telephone Encounter (Signed)
ALPRAZolam Duanne Moron) 0.25 MG tablet   [facility]  Patient is leaving town tomorrow.   [phone]

## 2014-12-30 NOTE — Telephone Encounter (Signed)
Side prescription and left at 104 I will have them faxed to [place] in the morning

## 2014-12-31 DIAGNOSIS — L718 Other rosacea: Secondary | ICD-10-CM | POA: Diagnosis not present

## 2014-12-31 DIAGNOSIS — L218 Other seborrheic dermatitis: Secondary | ICD-10-CM | POA: Diagnosis not present

## 2014-12-31 DIAGNOSIS — L820 Inflamed seborrheic keratosis: Secondary | ICD-10-CM | POA: Diagnosis not present

## 2014-12-31 DIAGNOSIS — L821 Other seborrheic keratosis: Secondary | ICD-10-CM | POA: Diagnosis not present

## 2014-12-31 DIAGNOSIS — Z85828 Personal history of other malignant neoplasm of skin: Secondary | ICD-10-CM | POA: Diagnosis not present

## 2014-12-31 NOTE — Telephone Encounter (Signed)
Pt called again and I called the Rx into [facility].

## 2015-03-13 DIAGNOSIS — N200 Calculus of kidney: Secondary | ICD-10-CM | POA: Diagnosis not present

## 2015-03-13 DIAGNOSIS — Z8551 Personal history of malignant neoplasm of bladder: Secondary | ICD-10-CM | POA: Diagnosis not present

## 2015-03-13 DIAGNOSIS — C659 Malignant neoplasm of unspecified renal pelvis: Secondary | ICD-10-CM | POA: Diagnosis not present

## 2015-03-24 ENCOUNTER — Encounter: Payer: Self-pay | Admitting: Emergency Medicine

## 2015-04-09 DIAGNOSIS — Z23 Encounter for immunization: Secondary | ICD-10-CM | POA: Diagnosis not present

## 2015-06-17 IMAGING — US US SOFT TISSUE HEAD/NECK
1 series · 13 of 25 positions shown · non-contrast
Comparison: Prior thyroid ultrasound 10/29/2013

CLINICAL DATA: 73-year-old male with known thyroid nodule.

EXAM:
THYROID ULTRASOUND
TECHNIQUE: Ultrasound examination of the thyroid gland and adjacent soft
tissues was performed.

[Series 1: us soft tissue head/neck · 0.05mm/px · 13 of 51 slices shown]
[im 1/51]
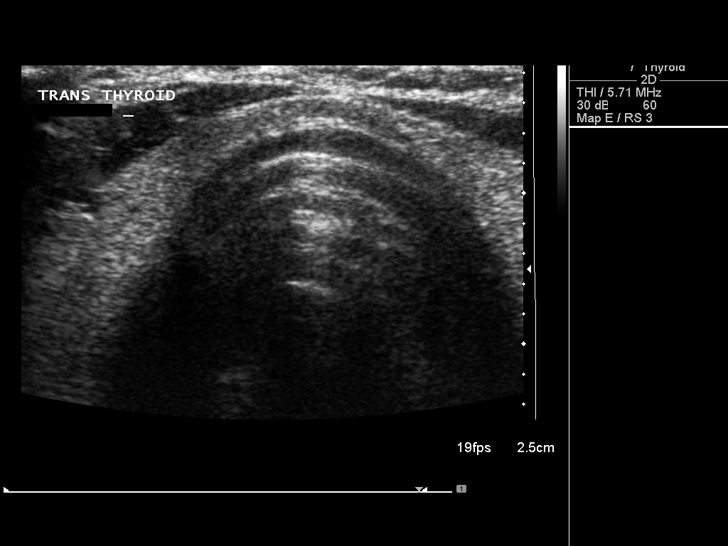
[im 5/51]
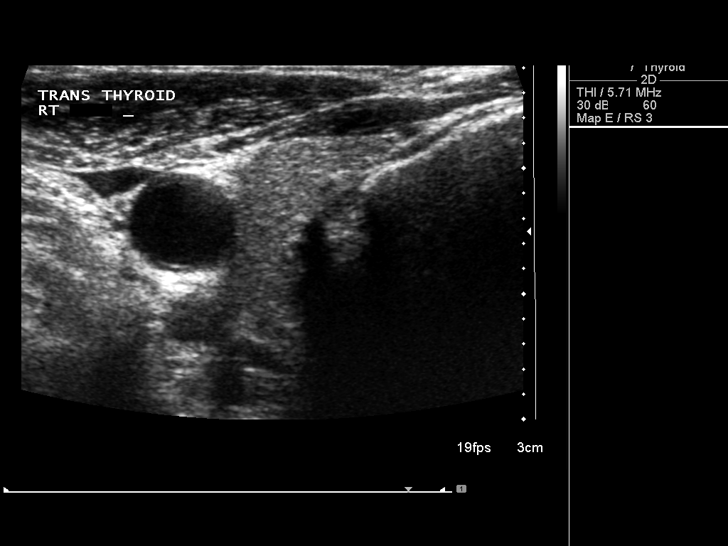
[im 9/51]
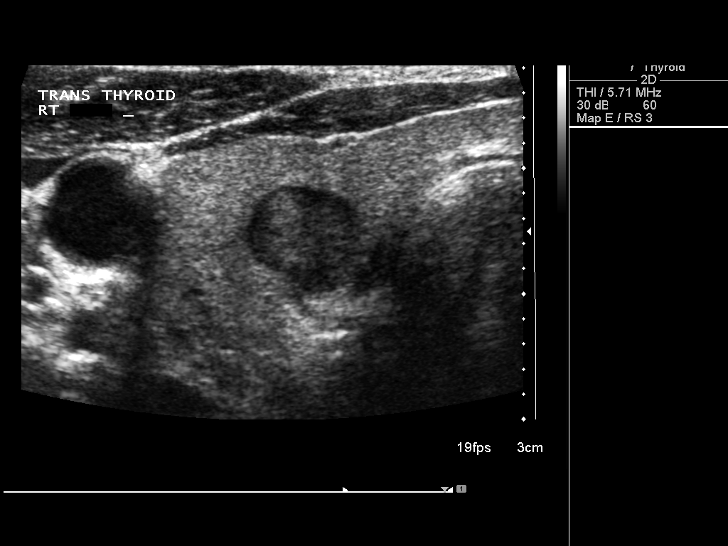
[im 13/51]
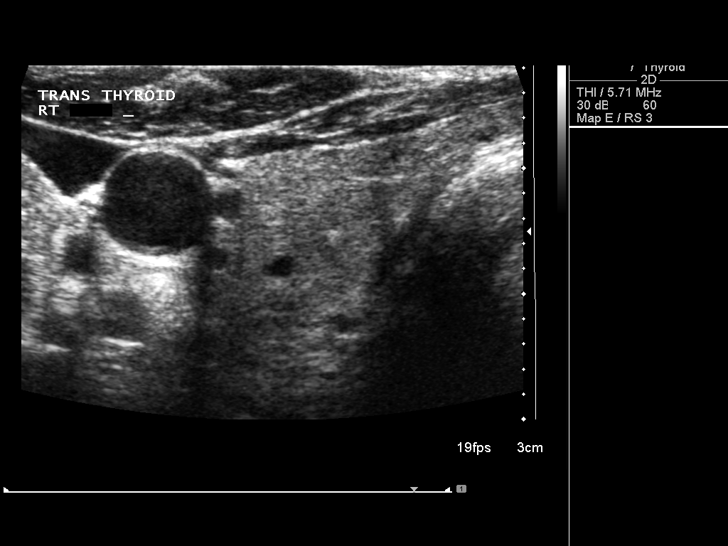
[im 17/51]
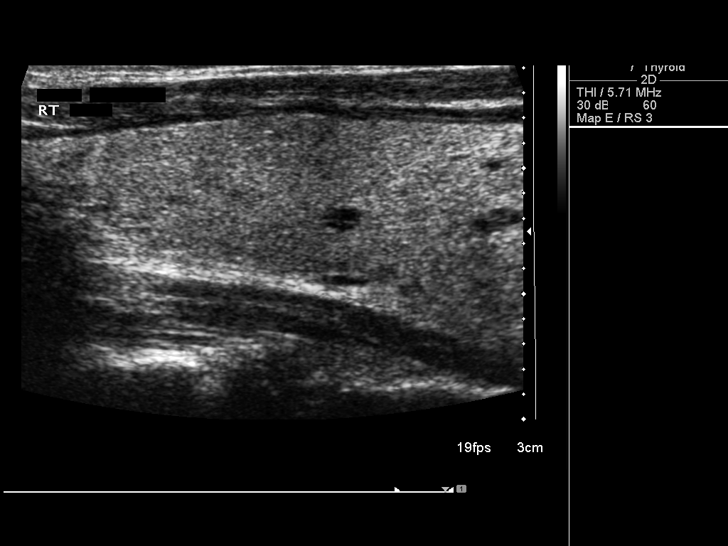
[im 21/51]
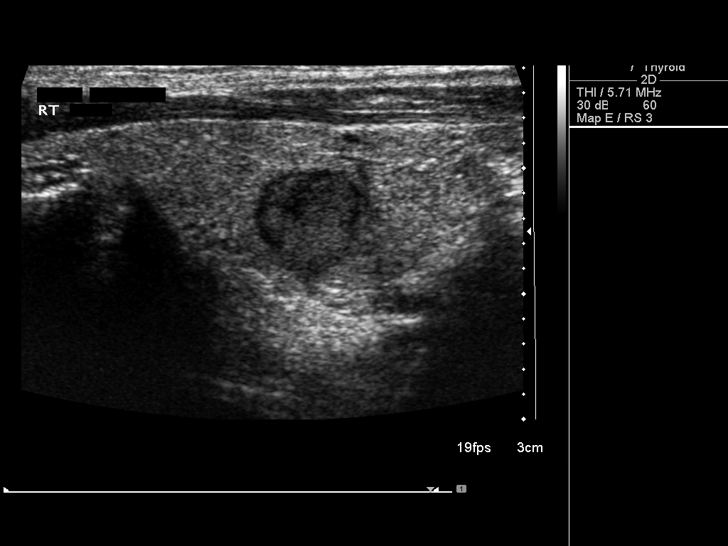
[im 26/51]
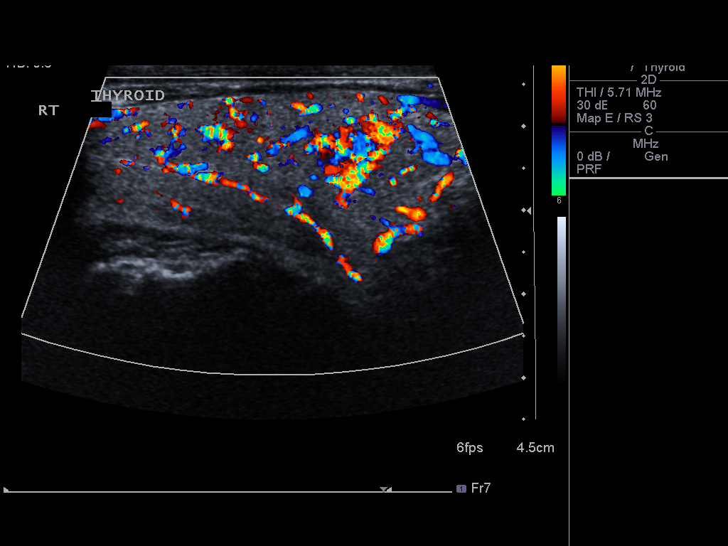
[im 30/51]
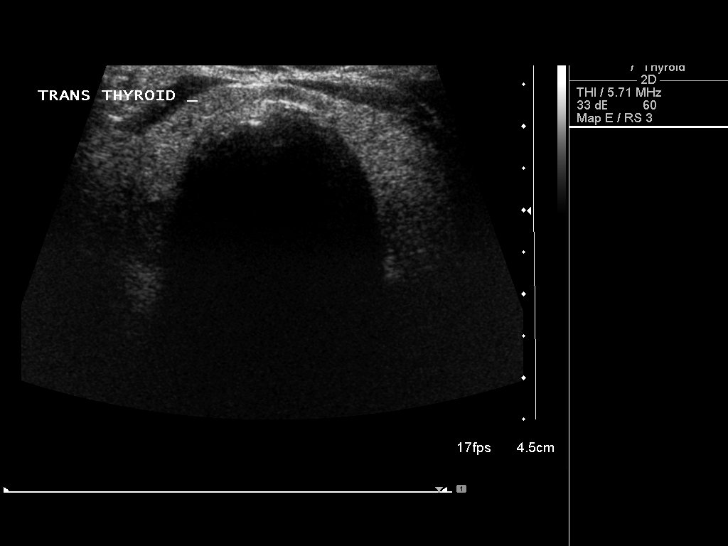
[im 34/51]
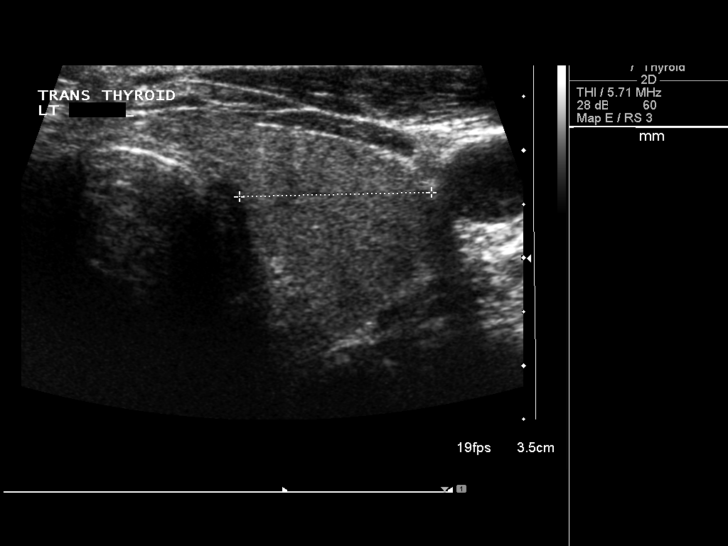
[im 38/51]
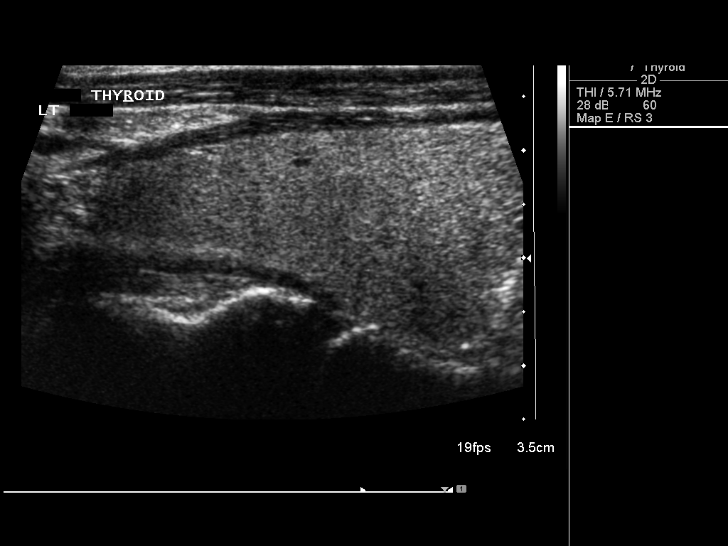
[im 42/51]
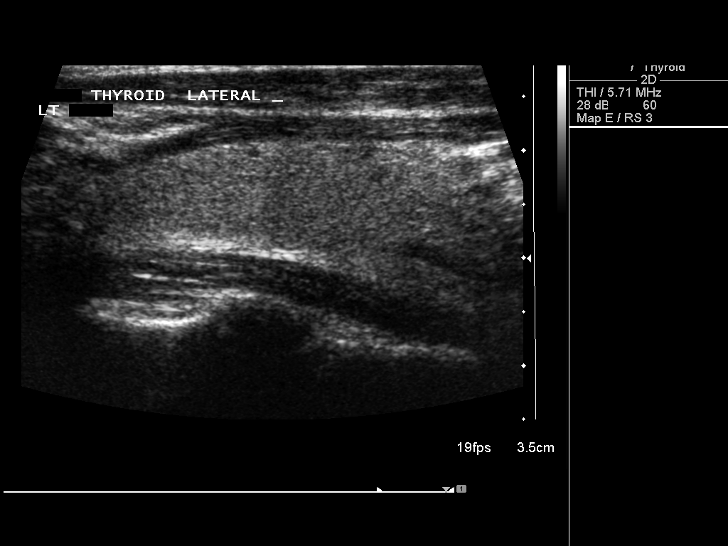
[im 46/51]
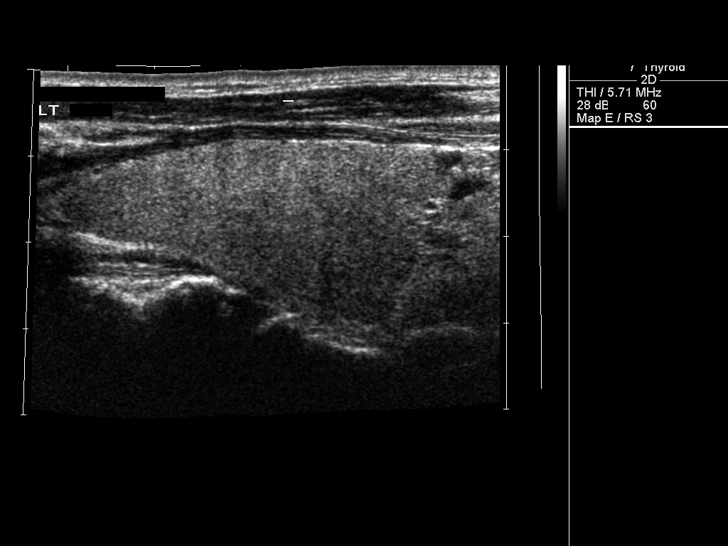
[im 51/51]
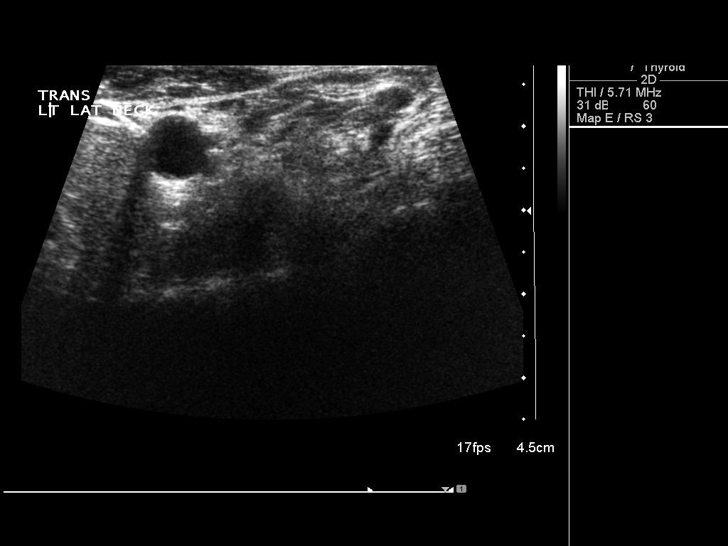

[13 of 25 positions shown; findings below may reference images not displayed]

FINDINGS: Right thyroid lobe

Measurements: 4.6 x 2.2 x 1.9 cm. A solitary hypoechoic nodule is
again noted in the medial aspect of the mid thyroid gland. The
nodule measures 10 x 9 x 7 mm compared to a maximum of 9 mm
previously. This is similar to incrementally enlarged. No suspicious
microcalcifications.

Left thyroid lobe

Measurements: 4.4 x 2.1 x 1.8 cm. No significant nodule identified.
A few tiny 2 mm nodules/cysts are noted incidentally.

Isthmus

Thickness: 0.3 cm.  No nodules visualized.

Lymphadenopathy

None visualized.
IMPRESSION: No significant interval change in the size or appearance of the
hypoechoic nodule within the right thyroid gland. The nodule
measures 10 mm today compared to 9 mm previously. This slight
difference is within the the range of measurement error.

Findings do not meet current SRU consensus criteria for biopsy.
Follow-up by clinical exam is recommended. If patient has known risk
factors for thyroid carcinoma, consider follow-up ultrasound in 12
months. If patient is clinically hyperthyroid, consider nuclear
medicine thyroid uptake and scan.Reference: Management of Thyroid
Nodules Detected at US: Society of Radiologists in Ultrasound

## 2015-07-01 ENCOUNTER — Other Ambulatory Visit: Payer: Self-pay | Admitting: Emergency Medicine

## 2015-07-05 ENCOUNTER — Other Ambulatory Visit: Payer: Self-pay | Admitting: Emergency Medicine

## 2015-07-06 NOTE — Telephone Encounter (Signed)
Faxed

## 2015-08-14 ENCOUNTER — Other Ambulatory Visit: Payer: Self-pay | Admitting: Emergency Medicine

## 2015-08-17 NOTE — Telephone Encounter (Signed)
Faxed

## 2015-08-28 DIAGNOSIS — N138 Other obstructive and reflux uropathy: Secondary | ICD-10-CM | POA: Diagnosis not present

## 2015-08-28 DIAGNOSIS — N401 Enlarged prostate with lower urinary tract symptoms: Secondary | ICD-10-CM | POA: Diagnosis not present

## 2015-08-28 DIAGNOSIS — Z0000 Encounter for general adult medical examination without abnormal findings: Secondary | ICD-10-CM | POA: Diagnosis not present

## 2015-08-28 DIAGNOSIS — Z8551 Personal history of malignant neoplasm of bladder: Secondary | ICD-10-CM | POA: Diagnosis not present

## 2015-08-28 DIAGNOSIS — N200 Calculus of kidney: Secondary | ICD-10-CM | POA: Diagnosis not present

## 2015-08-28 DIAGNOSIS — N189 Chronic kidney disease, unspecified: Secondary | ICD-10-CM | POA: Diagnosis not present

## 2015-09-07 ENCOUNTER — Encounter: Payer: Self-pay | Admitting: Internal Medicine

## 2015-09-30 DIAGNOSIS — N183 Chronic kidney disease, stage 3 (moderate): Secondary | ICD-10-CM | POA: Diagnosis not present

## 2015-09-30 DIAGNOSIS — Z9889 Other specified postprocedural states: Secondary | ICD-10-CM | POA: Diagnosis not present

## 2015-09-30 DIAGNOSIS — Z905 Acquired absence of kidney: Secondary | ICD-10-CM | POA: Diagnosis not present

## 2015-09-30 DIAGNOSIS — R634 Abnormal weight loss: Secondary | ICD-10-CM | POA: Diagnosis not present

## 2015-09-30 DIAGNOSIS — I10 Essential (primary) hypertension: Secondary | ICD-10-CM | POA: Diagnosis not present

## 2015-10-21 ENCOUNTER — Other Ambulatory Visit: Payer: Self-pay | Admitting: Emergency Medicine

## 2015-10-23 ENCOUNTER — Other Ambulatory Visit: Payer: Self-pay | Admitting: Emergency Medicine

## 2015-11-17 ENCOUNTER — Other Ambulatory Visit: Payer: Self-pay | Admitting: Emergency Medicine

## 2016-01-05 ENCOUNTER — Ambulatory Visit: Admit: 2016-01-05 | Discharge: 2016-01-05 | Payer: MEDICARE | Attending: Urology | Primary: Family Medicine

## 2016-01-05 DIAGNOSIS — Z8551 Personal history of malignant neoplasm of bladder: Secondary | ICD-10-CM | POA: Diagnosis not present

## 2016-01-05 DIAGNOSIS — Z85528 Personal history of other malignant neoplasm of kidney: Secondary | ICD-10-CM | POA: Diagnosis not present

## 2016-01-05 DIAGNOSIS — N289 Disorder of kidney and ureter, unspecified: Secondary | ICD-10-CM | POA: Diagnosis not present

## 2016-01-05 LAB — AMB POC URINALYSIS DIP STICK AUTO W/O MICRO
Bilirubin (UA POC): NEGATIVE
Blood (UA POC): NEGATIVE
Glucose (UA POC): NEGATIVE
Ketones (UA POC): NEGATIVE
Leukocyte esterase (UA POC): NEGATIVE
Nitrites (UA POC): NEGATIVE
Specific gravity (UA POC): 1.01 (ref 1.001–1.035)
Urobilinogen (UA POC): 0.2 (ref 0.2–1)
pH (UA POC): 5.5 (ref 4.6–8.0)

## 2016-01-05 NOTE — Progress Notes (Signed)
ASSESSMENT:   1. Hx of bladder cancer    2. History of kidney cancer    3. Renal insufficiency          1. 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 One small papillary bladder recurrence 07/2012. Treated with office fulguration. No recurrence since. Last cysto with NED 02/2015.     2. Renal insufficiency- most recent Cr 2.36 on 08/31/15. Has consult with nephrologist here.     3. Nephrolithiasis- pt reports history of kidney stones.        PLAN:    1. Urine sent for cytology.   2. Chest XR and CT A/P wo cont to check on disease and nephrolithiasis.   3. Follow up with nephrologist as recommended.   4. Recommend establishing care with PCP.      RTO for cysto, imaging review.        Chief Complaint   Patient presents with   ??? Bladder Cancer       HISTORY OF PRESENT ILLNESS:  Brad Martinez. is a 75 y.o. male who is seen in consultation as referred by Dr. Rana Snare in [facility] for urothelial carcinoma. Pt just recently moved from [place] in mid June and is establishing care here. 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 One small papillary bladder recurrence 07/2012. Treated with office fulguration. No recurrence since. Last cysto with NED 02/2015. Pt does not recall having recent scans to check on status of disease. Pt presents today doing well.     He states he has no real bothersome voiding complaints at baseline. Denies dysuria, gross hematuria, hesitancy, urgency, frequency. He is on no GU medications.       No other significant medical problems or medical history.     Previous surgeries: S/p 3 hernia surgeries.     No flowsheet data found.      Past Medical History:   Diagnosis Date   ??? Hypertension        History reviewed. No pertinent surgical history.    Social History   Substance Use Topics   ??? Smoking status: Former Smoker     Packs/day: 2.00      Years: 25.00     Quit date: 07/09/1978   ??? Smokeless tobacco: Never Used      Comment: Smokes pipe X 36yrs   ??? Alcohol use Yes       No Known Allergies    History reviewed. No pertinent family history.    Current Outpatient Prescriptions   Medication Sig Dispense Refill   ??? OTHER Indications: anxiety med     ??? OTHER Indications: cannot remember blood pressure medications (2)         Review of Systems  Constitutional: Fever: No  Skin: Rash: No  HEENT: Hearing difficulty: Yes  (L) ear deficient  Eyes: Blurred vision: No  Cardiovascular: Chest pain: No  Respiratory: Shortness of breath: No  Gastrointestinal: Nausea/vomiting: No  Musculoskeletal: Back pain: Yes  lower back  Neurological: Weakness: No  Psychological: Memory loss: No  Comments/additional findings:         PHYSICAL EXAMINATION:   Visit Vitals   ??? BP 144/70   ??? Ht 5\' 10"  (1.778 m)   ??? Wt 145 lb (65.8 kg)   ??? BMI 20.81 kg/m2     Constitutional: WDWN, Pleasant and appropriate affect, No acute distress.    CV:  No  peripheral swelling noted  Respiratory: No respiratory distress or difficulties  Abdomen:  No abdominal masses or tenderness. No CVA tenderness. No inguinal hernias noted.   GU Male:    SCROTUM:  No scrotal rash or lesions noticed.  Normal bilateral testes and epididymis.   PENIS: Urethral meatus normal in location and size. No urethral discharge.  Skin: No jaundice.    Neuro/Psych:  Alert and oriented x 3, affect appropriate.   Lymphatic:   No enlarged inguinal lymph nodes.        REVIEW OF LABS AND IMAGING:    Results for orders placed or performed in visit on 01/05/16   AMB POC URINALYSIS DIP STICK AUTO W/O MICRO   Result Value Ref Range    Color (UA POC) Yellow     Clarity (UA POC) Clear     Glucose (UA POC) Negative Negative    Bilirubin (UA POC) Negative Negative    Ketones (UA POC) Negative Negative    Specific gravity (UA POC) 1.010 1.001 - 1.035    Blood (UA POC) Negative Negative    pH (UA POC) 5.5 4.6 - 8.0     Protein (UA POC) Trace Negative mg/dL    Urobilinogen (UA POC) 0.2 mg/dL 0.2 - 1    Nitrites (UA POC) Negative Negative    Leukocyte esterase (UA POC) Negative Negative         A copy of today's office visit with all pertinent imaging results and labs were sent to the referring physician.        Kathreen Cornfield, MD           Medical documentation provided with the assistance of [facility], medical scribe for Kathreen Cornfield, MD.

## 2016-01-07 LAB — CYTOLOGY NON GYN, UROLOGY OF ~~LOC~~ LAB

## 2016-01-13 NOTE — Progress Notes (Signed)
Faxed referral request to Dr. Delfino Lovett' office with demographics and office notes to [phone].

## 2016-01-15 ENCOUNTER — Ambulatory Visit: Admit: 2016-01-15 | Discharge: 2016-01-15 | Payer: MEDICARE | Attending: Urology | Primary: Family Medicine

## 2016-01-15 DIAGNOSIS — C689 Malignant neoplasm of urinary organ, unspecified: Secondary | ICD-10-CM | POA: Diagnosis not present

## 2016-01-15 DIAGNOSIS — R351 Nocturia: Secondary | ICD-10-CM | POA: Diagnosis not present

## 2016-01-15 LAB — AMB POC URINALYSIS DIP STICK AUTO W/O MICRO
Bilirubin (UA POC): NEGATIVE
Blood (UA POC): NEGATIVE
Glucose (UA POC): NEGATIVE
Ketones (UA POC): NEGATIVE
Leukocyte esterase (UA POC): NEGATIVE
Nitrites (UA POC): NEGATIVE
Specific gravity (UA POC): 1.01 (ref 1.001–1.035)
Urobilinogen (UA POC): 0.2 (ref 0.2–1)
pH (UA POC): 5.5 (ref 4.6–8.0)

## 2016-01-15 NOTE — Progress Notes (Signed)
CYSTOSCOPY PROCEDURE        Patient Name: Brad Martinez.            Date of Procedure: 01/15/2016     Pre-procedure Diagnosis:     ICD-10-CM ICD-9-CM   1. 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 Nocturia R35.1 788.43        Post-procedure Diagnosis: Same    Consent:  All risks, benefits and options were reviewed in detail and the patient agrees to procedure. Risks include but are not limited to bleeding, infection, sepsis, death, dysuria and others.     Procedure:  The patient was placed in the supine position, and prepped and draped in the normal fashion. 5 ml of 4% Lidocaine gel was placed in the urethra. Once adequate anesthesia was achieved; the flexible cystoscope was placed into the bladder.     Findings as follows:      Meatus: normal  Urethra: normal  Prostate: Enlarged  Bladder neck: normal  Trigone:  normal  Trabeculation:1+  Diverticuli:  none  Lesion:  None, prior resection site on left lateral wall without lesions    Antibiotic provided:  Yes     Lab / Imaging:   Results for orders placed or performed in visit on 01/15/16   AMB POC URINALYSIS DIP STICK AUTO W/O MICRO   Result Value Ref Range    Color (UA POC) Yellow     Clarity (UA POC) Clear     Glucose (UA POC) Negative Negative    Bilirubin (UA POC) Negative Negative    Ketones (UA POC) Negative Negative    Specific gravity (UA POC) 1.010 1.001 - 1.035    Blood (UA POC) Negative Negative    pH (UA POC) 5.5 4.6 - 8.0    Protein (UA POC) 1+ Negative mg/dL    Urobilinogen (UA POC) 0.2 mg/dL 0.2 - 1    Nitrites (UA POC) Negative Negative    Leukocyte esterase (UA POC) Negative Negative        Assessment:  Pt is a 75 y.o. 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 One small papillary bladder recurrence 07/2012. Treated with office fulguration. No recurrence since. Last cysto with NED 02/2015.     Plan:    Will arrange for CT abd/pelvis and CXR with follow-up in clinic after    If no evidence of recurrence will pain for surveillance in 1 year    Rexene Alberts, MD    Kathreen Cornfield, MD  [facility]  [place], [place]  Phone: [phone]  Pager: [phone]           Medical Documentation is provided with the assistance of [employer] Arsenio Loader, Medical Scribe for Kathreen Cornfield, MD on 01/15/2016.

## 2016-01-17 LAB — URINE C&S

## 2016-01-18 NOTE — Progress Notes (Signed)
PT is scheduled for CT AP and CXR on Wednesday January 20, 2016 to arrive at [place] at 4:15pm. PT is aware.

## 2016-01-19 ENCOUNTER — Encounter

## 2016-01-20 LAB — CYTOLOGY NON GYN, UROLOGY OF ~~LOC~~ LAB

## 2016-01-21 ENCOUNTER — Encounter

## 2016-01-29 ENCOUNTER — Ambulatory Visit: Admit: 2016-01-29 | Discharge: 2016-01-29 | Payer: MEDICARE | Attending: Urology | Primary: Family Medicine

## 2016-01-29 DIAGNOSIS — C689 Malignant neoplasm of urinary organ, unspecified: Secondary | ICD-10-CM | POA: Diagnosis not present

## 2016-01-29 LAB — AMB POC URINALYSIS DIP STICK AUTO W/O MICRO
Bilirubin (UA POC): NEGATIVE
Blood (UA POC): NEGATIVE
Glucose (UA POC): NEGATIVE
Ketones (UA POC): NEGATIVE
Leukocyte esterase (UA POC): NEGATIVE
Nitrites (UA POC): NEGATIVE
Specific gravity (UA POC): 1.025 (ref 1.001–1.035)
Urobilinogen (UA POC): 0.2 (ref 0.2–1)
pH (UA POC): 5.5 (ref 4.6–8.0)

## 2016-01-29 NOTE — Progress Notes (Signed)
Tia Alert.  DOB [birth year]  75 y.o.       UROLOGY ESTABLISHED PATIENT        ASSESSMENT:     ICD-10-CM ICD-9-CM   1. 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 One small papillary bladder recurrence 07/2012. Treated with office fulguration. No recurrence since. Last cysto with NED 12/2015.    2. Renal insufficiency- most recent Cr 2.36 on 08/31/15. Has consult with nephrologist here.     3. Nephrolithiasis- pt reports history of kidney stones.     4. S/p 3 hernia surgeries.        PLAN:    Reviewed CT A/P and chest x-ray, negative for recurrent urothelial cancer.   Cytology 01/15/16 negative for malignacy  Plan to continue surveillance yearly moving forward.   Follow up w/ PCP for enlarged Aorta found on imaging.    Chest PA and Lateral ordered today.   RTC in one year for cysto and review of imaging. Will hold on further cross sectional imaging as patient over 5 year since NU      Chief Complaint   Patient presents with   ??? Other     urothelial carcinoma       HISTORY OF PRESENT ILLNESS:  Brad Martinez. is a 75 y.o. male who is seen in follow up for urothelial carcinoma.   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   One small papillary bladder recurrence 07/2012.   Treated with office fulguration.   No recurrence since.   Last cysto completed last visit w/ no evidence of recurrence.     Pt follows up to review recent scans to check on status of disease.   CT A/P WO Cont w/o abdominopelvic tumor recurrence.  Other findings include an enlarged aorta measuring 3 cm.   Pt Chest X-ray has no signs of cardiopulmonary disease.      Pt presents today doing well.   He states he has no real bothersome voiding complaints at baseline.   Pt is on no GU medications.    Denies dysuria, gross hematuria, hesitancy, urgency, frequency.    No other significant medical problems or medical history.       Past Medical History:   Diagnosis Date   ??? Hypertension    ??? 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 HX LITHOTRIPSY Right 06/05/2003    s/p right ESWL - [place], [place]   ??? HX UROLOGICAL Left 02/24/2010    s/p nephroureterectomy - Dr. Diona Fanti, [place], [place]   ??? HX UROLOGICAL  2011    s/p cysto with fulguration of medium lesion - [place], [place]   ??? HX UROLOGICAL  11/11/2009    Cysto, bilateral RPG, left URS w/ bx, bilateral jj stents, and TURBT - Dr. Marcelline Mates, [place]     Social History   Substance Use Topics   ??? Smoking status: Current Every Day Smoker     Types: Pipe   ??? Smokeless tobacco: Never Used      Comment: Smokes pipe X 36yrs   ??? Alcohol use Yes     No Known Allergies    History reviewed. No pertinent family  history.    Current Outpatient Prescriptions   Medication Sig Dispense Refill   ??? ALPRAZolam (XANAX) 0.25 mg tablet TAKE 1/2 TO 1 TABLET AS NEEDED FOR STRESS.     ??? amLODIPine (NORVASC) 5 mg tablet Take 5 mg by mouth.     ??? aspirin-calcium carbonate 81 mg-300 mg calcium(777 mg) tab Take 81 mg by mouth.     ??? diphenhydrAMINE (BENADRYL) 25 mg capsule Take 25 mg by mouth.     ??? multivitamin, tx-iron-ca-min (THERA M PLUS, FERROUS FUMARAT,) 9 mg iron-400 mcg tab tablet Take 1 Tab by mouth.     ??? OTHER Indications: anxiety med     ??? OTHER Indications: cannot remember blood pressure medications (2)         Review of Systems  Constitutional: Fever: No  Skin: Rash: No  HEENT: Hearing difficulty: No  Eyes: Blurred vision: No  Cardiovascular: Chest pain: No  Respiratory: Shortness of breath: No  Gastrointestinal: Nausea/vomiting: No  Musculoskeletal: Back pain: No  Neurological: Weakness: No  Psychological: Memory loss: No  Comments/additional findings:       PHYSICAL EXAMINATION:   Visit Vitals   ??? BP 142/90    ??? Ht 5\' 10"  (1.778 m)   ??? Wt 150 lb (68 kg)   ??? BMI 21.52 kg/m2   Constitutional: WDWN, Pleasant and appropriate affect, No acute distress.    CV:  No peripheral swelling noted  Respiratory: No respiratory distress or difficulties  Abdomen:  No abdominal masses or tenderness. No CVA tenderness. No inguinal hernias noted.   Skin: No jaundice.    Neuro/Psych:  Alert and oriented x 3, affect appropriate.   Lymphatic:   No enlarged inguinal lymph nodes.        REVIEW OF LABS AND IMAGING:    Results for orders placed or performed in visit on 01/29/16   AMB POC URINALYSIS DIP STICK AUTO W/O MICRO   Result Value Ref Range    Color (UA POC) Yellow     Clarity (UA POC) Clear     Glucose (UA POC) Negative Negative    Bilirubin (UA POC) Negative Negative    Ketones (UA POC) Negative Negative    Specific gravity (UA POC) 1.025 1.001 - 1.035    Blood (UA POC) Negative Negative    pH (UA POC) 5.5 4.6 - 8.0    Protein (UA POC) 1+ Negative mg/dL    Urobilinogen (UA POC) 0.2 mg/dL 0.2 - 1    Nitrites (UA POC) Negative Negative    Leukocyte esterase (UA POC) Negative Negative       CT A/P WO Cont 01/20/2016  IMPRESSION:  No CT evidence for abdominopelvic tumor recurrence.  Left neck for ureterectomy.  Small 3 cm infrarenal fusiform abdominal aortic aneurysm, essentially unchanged. ??    Kidneys/ureters: ??The left kidney /ureter have been surgically removed. ??The right kidney is normal in size, morphology and position. ??There is a probable 10 mm cyst in the lateral upper pole. ??No stones, hydronephrosis or pararenal fluid collections are seen. ??The right ureter and bladder showed no visible abnormalities..       XR Chest PA and Lateral 01/20/2016  IMPRESSION:  No acute cardiopulmonary process.        A copy of today's office visit with all pertinent imaging results and labs were sent to the referring physician.        Kathreen Cornfield, MD  [facility]  [place], [place]  Phone: [phone]   Pager: [phone]         Medical Documentation is provided with the assistance of [employer] Arsenio Loader, Medical Scribe for Kathreen Cornfield, MD on 01/29/2016.

## 2016-02-18 ENCOUNTER — Other Ambulatory Visit: Payer: Self-pay

## 2016-02-23 ENCOUNTER — Telehealth: Payer: Self-pay

## 2016-02-23 NOTE — Telephone Encounter (Signed)
Moving to [place] - wants to transfer him anxiety medication and blood pressure to [facility] on [address] -   Fax [phone]  BCN _ [phone]  Daub patient

## 2016-02-23 NOTE — Telephone Encounter (Signed)
Please f/u

## 2016-02-24 NOTE — Telephone Encounter (Signed)
Medications for anxiety cannot be transferred to another state. My license is in [place] not [place]. Have him let us know what he wants [place] to do.

## 2016-02-25 ENCOUNTER — Telehealth: Payer: Self-pay | Admitting: Emergency Medicine

## 2016-02-25 NOTE — Telephone Encounter (Signed)
Left message to return call 

## 2016-03-21 DIAGNOSIS — Z23 Encounter for immunization: Secondary | ICD-10-CM | POA: Diagnosis not present

## 2016-03-21 DIAGNOSIS — I10 Essential (primary) hypertension: Secondary | ICD-10-CM | POA: Diagnosis not present

## 2016-03-21 DIAGNOSIS — F419 Anxiety disorder, unspecified: Secondary | ICD-10-CM | POA: Diagnosis not present

## 2016-04-11 ENCOUNTER — Telehealth: Payer: Self-pay

## 2016-04-11 NOTE — Telephone Encounter (Signed)
Patient is requesting all of his medical records to be transferred over to [facility] family medicine.  Address: [place] [address]  Phone: [phone]  Fax: [phone]

## 2016-04-14 NOTE — Telephone Encounter (Signed)
Did the patient complete a release of information form.

## 2016-06-08 DIAGNOSIS — F419 Anxiety disorder, unspecified: Secondary | ICD-10-CM | POA: Diagnosis not present

## 2016-06-08 DIAGNOSIS — I10 Essential (primary) hypertension: Secondary | ICD-10-CM | POA: Diagnosis not present

## 2016-06-08 DIAGNOSIS — M25552 Pain in left hip: Secondary | ICD-10-CM | POA: Diagnosis not present

## 2016-07-12 DIAGNOSIS — Z125 Encounter for screening for malignant neoplasm of prostate: Secondary | ICD-10-CM | POA: Diagnosis not present

## 2016-07-12 DIAGNOSIS — I10 Essential (primary) hypertension: Secondary | ICD-10-CM | POA: Diagnosis not present

## 2016-07-12 DIAGNOSIS — C649 Malignant neoplasm of unspecified kidney, except renal pelvis: Secondary | ICD-10-CM | POA: Diagnosis not present

## 2016-07-12 DIAGNOSIS — Z1159 Encounter for screening for other viral diseases: Secondary | ICD-10-CM | POA: Diagnosis not present

## 2016-07-12 DIAGNOSIS — C679 Malignant neoplasm of bladder, unspecified: Secondary | ICD-10-CM | POA: Diagnosis not present

## 2016-07-12 DIAGNOSIS — M25552 Pain in left hip: Secondary | ICD-10-CM | POA: Diagnosis not present

## 2016-07-12 DIAGNOSIS — F419 Anxiety disorder, unspecified: Secondary | ICD-10-CM | POA: Diagnosis not present

## 2016-07-19 DIAGNOSIS — F419 Anxiety disorder, unspecified: Secondary | ICD-10-CM | POA: Diagnosis not present

## 2016-07-19 DIAGNOSIS — J069 Acute upper respiratory infection, unspecified: Secondary | ICD-10-CM | POA: Diagnosis not present

## 2016-07-19 DIAGNOSIS — I10 Essential (primary) hypertension: Secondary | ICD-10-CM | POA: Diagnosis not present

## 2016-08-12 ENCOUNTER — Telehealth: Payer: Self-pay

## 2016-08-12 NOTE — Telephone Encounter (Signed)
Pt calling to give a verbal for medical records to be released  I did not have a form dow here to fill out but he would like all of his records from our office released to [facility] family medicine fax: [phone]

## 2016-08-18 DIAGNOSIS — F418 Other specified anxiety disorders: Secondary | ICD-10-CM | POA: Diagnosis not present

## 2016-08-18 DIAGNOSIS — I10 Essential (primary) hypertension: Secondary | ICD-10-CM | POA: Diagnosis not present

## 2016-08-24 NOTE — Telephone Encounter (Signed)
Records were faxed over to [facility] family on 08/24/16

## 2016-08-30 DIAGNOSIS — L570 Actinic keratosis: Secondary | ICD-10-CM | POA: Diagnosis not present

## 2016-08-30 DIAGNOSIS — L821 Other seborrheic keratosis: Secondary | ICD-10-CM | POA: Diagnosis not present

## 2016-08-30 DIAGNOSIS — C44719 Basal cell carcinoma of skin of left lower limb, including hip: Secondary | ICD-10-CM | POA: Diagnosis not present

## 2016-08-30 DIAGNOSIS — L219 Seborrheic dermatitis, unspecified: Secondary | ICD-10-CM | POA: Diagnosis not present

## 2016-08-30 DIAGNOSIS — D485 Neoplasm of uncertain behavior of skin: Secondary | ICD-10-CM | POA: Diagnosis not present

## 2016-09-15 DIAGNOSIS — F419 Anxiety disorder, unspecified: Secondary | ICD-10-CM | POA: Diagnosis not present

## 2016-09-15 DIAGNOSIS — F329 Major depressive disorder, single episode, unspecified: Secondary | ICD-10-CM | POA: Diagnosis not present

## 2016-09-15 DIAGNOSIS — I10 Essential (primary) hypertension: Secondary | ICD-10-CM | POA: Diagnosis not present

## 2016-10-18 DIAGNOSIS — C642 Malignant neoplasm of left kidney, except renal pelvis: Secondary | ICD-10-CM | POA: Diagnosis not present

## 2016-10-18 DIAGNOSIS — I10 Essential (primary) hypertension: Secondary | ICD-10-CM | POA: Diagnosis not present

## 2016-10-18 DIAGNOSIS — N289 Disorder of kidney and ureter, unspecified: Secondary | ICD-10-CM | POA: Diagnosis not present

## 2016-10-25 DIAGNOSIS — C44719 Basal cell carcinoma of skin of left lower limb, including hip: Secondary | ICD-10-CM | POA: Diagnosis not present

## 2016-10-27 DIAGNOSIS — N189 Chronic kidney disease, unspecified: Secondary | ICD-10-CM | POA: Diagnosis not present

## 2016-11-15 DIAGNOSIS — F418 Other specified anxiety disorders: Secondary | ICD-10-CM | POA: Diagnosis not present

## 2016-11-15 DIAGNOSIS — N189 Chronic kidney disease, unspecified: Secondary | ICD-10-CM | POA: Diagnosis not present

## 2016-11-15 DIAGNOSIS — I129 Hypertensive chronic kidney disease with stage 1 through stage 4 chronic kidney disease, or unspecified chronic kidney disease: Secondary | ICD-10-CM | POA: Diagnosis not present

## 2016-11-21 DIAGNOSIS — C44719 Basal cell carcinoma of skin of left lower limb, including hip: Secondary | ICD-10-CM | POA: Diagnosis not present

## 2016-12-13 DIAGNOSIS — C44719 Basal cell carcinoma of skin of left lower limb, including hip: Secondary | ICD-10-CM | POA: Diagnosis not present

## 2016-12-15 DIAGNOSIS — I10 Essential (primary) hypertension: Secondary | ICD-10-CM | POA: Diagnosis not present

## 2016-12-15 DIAGNOSIS — H9203 Otalgia, bilateral: Secondary | ICD-10-CM | POA: Diagnosis not present

## 2016-12-15 DIAGNOSIS — F418 Other specified anxiety disorders: Secondary | ICD-10-CM | POA: Diagnosis not present

## 2017-01-06 DIAGNOSIS — H2513 Age-related nuclear cataract, bilateral: Secondary | ICD-10-CM | POA: Diagnosis not present

## 2017-01-17 DIAGNOSIS — F418 Other specified anxiety disorders: Secondary | ICD-10-CM | POA: Diagnosis not present

## 2017-01-17 DIAGNOSIS — I10 Essential (primary) hypertension: Secondary | ICD-10-CM | POA: Diagnosis not present

## 2017-01-23 DIAGNOSIS — N189 Chronic kidney disease, unspecified: Secondary | ICD-10-CM | POA: Diagnosis not present

## 2017-01-31 DIAGNOSIS — N179 Acute kidney failure, unspecified: Secondary | ICD-10-CM | POA: Diagnosis not present

## 2017-01-31 DIAGNOSIS — N184 Chronic kidney disease, stage 4 (severe): Secondary | ICD-10-CM | POA: Diagnosis not present

## 2017-01-31 DIAGNOSIS — I129 Hypertensive chronic kidney disease with stage 1 through stage 4 chronic kidney disease, or unspecified chronic kidney disease: Secondary | ICD-10-CM | POA: Diagnosis not present

## 2017-01-31 DIAGNOSIS — C642 Malignant neoplasm of left kidney, except renal pelvis: Secondary | ICD-10-CM | POA: Diagnosis not present

## 2017-02-03 ENCOUNTER — Ambulatory Visit: Admit: 2017-02-03 | Discharge: 2017-02-03 | Payer: MEDICARE | Attending: Urology | Primary: Family Medicine

## 2017-02-03 DIAGNOSIS — C689 Malignant neoplasm of urinary organ, unspecified: Secondary | ICD-10-CM | POA: Diagnosis not present

## 2017-02-03 LAB — AMB POC URINALYSIS DIP STICK AUTO W/O MICRO
Bilirubin (UA POC): NEGATIVE
Glucose (UA POC): NEGATIVE
Ketones (UA POC): NEGATIVE
Leukocyte esterase (UA POC): NEGATIVE
Nitrites (UA POC): NEGATIVE
Specific gravity (UA POC): 1.015 (ref 1.001–1.035)
Urobilinogen (UA POC): 0.2 (ref 0.2–1)
pH (UA POC): 5.5 (ref 4.6–8.0)

## 2017-02-03 NOTE — Progress Notes (Signed)
CYSTOSCOPY PROCEDURE        Patient Name: Brad Martinez.            Date of Procedure: 02/03/2017     Pre-procedure Diagnosis:   Encounter Diagnoses     ICD-10-CM ICD-9-CM   1. 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     Consent:  All risks, benefits and options were reviewed in detail and the patient agrees to procedure. Risks include but are not limited to bleeding, infection, sepsis, death, dysuria and others.     Universal Procedure Pause:    1. Correct patient confirmed:  yes  2. Allergies confirmed:  yes  3. Patient taking antiplatelet medications:  yes- ASA  4. Patient taking anticoagulants:  no  5. Correct procedure and side confirmed and consent signed:  yes  6. Correct antibiotics confirmed:  n/a    Procedure:  The patient was placed in the supine position, and prepped and draped in the normal fashion. 5 ml of 4% Lidocaine gel was placed in the urethra. Once adequate anesthesia was achieved; the flexible cystoscope was placed into the bladder.     Findings as follows:      Meatus: normal  Urethra: normal  Prostate: enlarged  Bladder neck: normal  Trigone:  normal  Trabeculation:1+  Diverticuli:  none  Lesion:  none    Left ureteral orrifice surgically absent    Lab / Imaging:   Results for orders placed or performed in visit on 02/03/17   XR CHEST PA LAT    Impression    SEND REPORT TO:  Quinn Axe, [place] [address]    FAX:  [phone]  Name:  GAVIN TELFORD     DOB:  22-Oct-1940  Gender:  Male      Exam Date:  February 03, 2017  Referring Provider:  Quinn Axe M.D.    RADIOGRAPHS OF THE CHEST    HISTORY: 76 year old male   SYMPTOMS: History of urothelial cancer.   REFERRING DIAGNOSIS: History of urothelial cancer.   TECHNIQUE: Frontal and lateral views   COMPARISON: None     FINDINGS:      The heart is normal in size.  Tortuous thoracic aorta with calcification  is noted.  The lungs are well expanded and clear without focal consolidation, pleural effusion, or pneumothorax.  No pulmonary nodules identified.  The visualized osseous structures are intact.  Mild degenerative change of the thoracic spine is noted.    IMPRESSION:    No acute cardiopulmonary process.  No evidence of metastatic disease.    Thank you for this referral.                        Interpreting Radiologist:  Clarene Reamer M.D.                               Electronically signed: Clarene Reamer  02/03/2017    Name:  BRADLEE HEITMAN                                 [facility] Chest PA/Lat  Exam Date:  February 03, 2017    ~~This report was copied and pasted from the servicing EHR system.~~     Results for orders placed or performed in visit on 02/03/17   AMB POC URINALYSIS DIP STICK AUTO W/O MICRO   Result Value Ref Range    Color (UA POC)      Clarity (UA POC)      Glucose (UA POC) Negative Negative    Bilirubin (UA POC) Negative Negative    Ketones (UA POC) Negative Negative    Specific gravity (UA POC) 1.015 1.001 - 1.035    Blood (UA POC) Trace Negative    pH (UA POC) 5.5 4.6 - 8.0    Protein (UA POC) 1+ Negative    Urobilinogen (UA POC) 0.2 mg/dL 0.2 - 1    Nitrites (UA POC) Negative Negative    Leukocyte esterase (UA POC) Negative Negative        Assessment:  1. 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 One small papillary bladder recurrence 07/2012. Treated with office fulguration. No recurrence since. Negative cystoscopy today (02/03/2017). Did not have CXR completed prior to today's visit as ordered  ??  2. Renal insufficiency- has renal US ordered by nephrologist for uptrending Cr 3.2  ??  3. Nephrolithiasis- pt reports history of kidney stones.   ??  4. S/p 3 hernia surgeries.     Plan:  ?? Cysto today negative for recurrence  ?? Urine sent for cytology today.  ?? CXR now, will forward patient results   ?? Plan to continue surveillance yearly moving forward.   ?? F/u with nephrology as scheduled  ?? RTC in 1 year for cystoscopy and CXR      Kathreen Cornfield, MD  [facility]  [place], [place]  Phone: [phone]  Pager: [phone]

## 2017-02-03 NOTE — Progress Notes (Signed)
Looks good. Please forward to patient

## 2017-02-06 DIAGNOSIS — R634 Abnormal weight loss: Secondary | ICD-10-CM | POA: Diagnosis not present

## 2017-02-06 DIAGNOSIS — Z1211 Encounter for screening for malignant neoplasm of colon: Secondary | ICD-10-CM | POA: Diagnosis not present

## 2017-02-06 DIAGNOSIS — R1310 Dysphagia, unspecified: Secondary | ICD-10-CM | POA: Diagnosis not present

## 2017-02-07 LAB — CYTOLOGY NON GYN, UROLOGY OF ~~LOC~~ LAB

## 2017-02-08 DIAGNOSIS — N200 Calculus of kidney: Secondary | ICD-10-CM | POA: Diagnosis not present

## 2017-02-08 DIAGNOSIS — I129 Hypertensive chronic kidney disease with stage 1 through stage 4 chronic kidney disease, or unspecified chronic kidney disease: Secondary | ICD-10-CM | POA: Diagnosis not present

## 2017-02-08 DIAGNOSIS — Z85528 Personal history of other malignant neoplasm of kidney: Secondary | ICD-10-CM | POA: Diagnosis not present

## 2017-02-08 DIAGNOSIS — N189 Chronic kidney disease, unspecified: Secondary | ICD-10-CM | POA: Diagnosis not present

## 2017-02-08 DIAGNOSIS — Z905 Acquired absence of kidney: Secondary | ICD-10-CM | POA: Diagnosis not present

## 2017-03-09 ENCOUNTER — Ambulatory Visit: Primary: Family Medicine

## 2017-03-09 ENCOUNTER — Inpatient Hospital Stay: Payer: MEDICARE

## 2017-03-09 DIAGNOSIS — K9181 Other intraoperative complications of digestive system: Secondary | ICD-10-CM | POA: Diagnosis not present

## 2017-03-09 DIAGNOSIS — Z7982 Long term (current) use of aspirin: Secondary | ICD-10-CM | POA: Diagnosis not present

## 2017-03-09 DIAGNOSIS — K259 Gastric ulcer, unspecified as acute or chronic, without hemorrhage or perforation: Secondary | ICD-10-CM | POA: Diagnosis not present

## 2017-03-09 DIAGNOSIS — K644 Residual hemorrhoidal skin tags: Secondary | ICD-10-CM | POA: Diagnosis not present

## 2017-03-09 DIAGNOSIS — K2970 Gastritis, unspecified, without bleeding: Secondary | ICD-10-CM | POA: Diagnosis not present

## 2017-03-09 DIAGNOSIS — K649 Unspecified hemorrhoids: Secondary | ICD-10-CM | POA: Diagnosis not present

## 2017-03-09 DIAGNOSIS — K2950 Unspecified chronic gastritis without bleeding: Secondary | ICD-10-CM | POA: Diagnosis not present

## 2017-03-09 DIAGNOSIS — N189 Chronic kidney disease, unspecified: Secondary | ICD-10-CM | POA: Diagnosis not present

## 2017-03-09 DIAGNOSIS — D120 Benign neoplasm of cecum: Secondary | ICD-10-CM | POA: Diagnosis not present

## 2017-03-09 DIAGNOSIS — K648 Other hemorrhoids: Secondary | ICD-10-CM | POA: Diagnosis not present

## 2017-03-09 DIAGNOSIS — F17290 Nicotine dependence, other tobacco product, uncomplicated: Secondary | ICD-10-CM | POA: Diagnosis not present

## 2017-03-09 DIAGNOSIS — K222 Esophageal obstruction: Secondary | ICD-10-CM | POA: Diagnosis not present

## 2017-03-09 DIAGNOSIS — K5730 Diverticulosis of large intestine without perforation or abscess without bleeding: Secondary | ICD-10-CM | POA: Diagnosis not present

## 2017-03-09 DIAGNOSIS — K228 Other specified diseases of esophagus: Secondary | ICD-10-CM | POA: Diagnosis not present

## 2017-03-09 DIAGNOSIS — F329 Major depressive disorder, single episode, unspecified: Secondary | ICD-10-CM | POA: Diagnosis not present

## 2017-03-09 DIAGNOSIS — D123 Benign neoplasm of transverse colon: Secondary | ICD-10-CM | POA: Diagnosis not present

## 2017-03-09 DIAGNOSIS — K635 Polyp of colon: Secondary | ICD-10-CM | POA: Diagnosis not present

## 2017-03-09 DIAGNOSIS — R1310 Dysphagia, unspecified: Secondary | ICD-10-CM | POA: Diagnosis not present

## 2017-03-09 DIAGNOSIS — I129 Hypertensive chronic kidney disease with stage 1 through stage 4 chronic kidney disease, or unspecified chronic kidney disease: Secondary | ICD-10-CM | POA: Diagnosis not present

## 2017-03-09 DIAGNOSIS — Z8553 Personal history of malignant neoplasm of renal pelvis: Secondary | ICD-10-CM | POA: Diagnosis not present

## 2017-03-09 DIAGNOSIS — Z79899 Other long term (current) drug therapy: Secondary | ICD-10-CM | POA: Diagnosis not present

## 2017-03-09 DIAGNOSIS — F419 Anxiety disorder, unspecified: Secondary | ICD-10-CM | POA: Diagnosis not present

## 2017-03-09 DIAGNOSIS — Z1211 Encounter for screening for malignant neoplasm of colon: Secondary | ICD-10-CM | POA: Diagnosis not present

## 2017-03-09 DIAGNOSIS — D122 Benign neoplasm of ascending colon: Secondary | ICD-10-CM | POA: Diagnosis not present

## 2017-03-09 DIAGNOSIS — Z8551 Personal history of malignant neoplasm of bladder: Secondary | ICD-10-CM | POA: Diagnosis not present

## 2017-03-09 MED ORDER — LIDOCAINE 4 % TOPICAL SOLN
4 % (0 mg/mL) | Status: DC | PRN
Start: 2017-03-09 — End: 2017-03-09
  Administered 2017-03-09: 15:00:00 via TOPICAL

## 2017-03-09 MED ORDER — SODIUM CHLORIDE 0.9 % IJ SYRG
INTRAMUSCULAR | Status: DC | PRN
Start: 2017-03-09 — End: 2017-03-09

## 2017-03-09 MED ORDER — SODIUM CHLORIDE 0.9 % IV
INTRAVENOUS | Status: DC
Start: 2017-03-09 — End: 2017-03-09

## 2017-03-09 MED ORDER — SODIUM CHLORIDE 0.9 % IJ SYRG
Freq: Three times a day (TID) | INTRAMUSCULAR | Status: DC
Start: 2017-03-09 — End: 2017-03-09

## 2017-03-09 MED ORDER — PROPOFOL 10 MG/ML IV EMUL
10 mg/mL | INTRAVENOUS | Status: DC | PRN
Start: 2017-03-09 — End: 2017-03-09
  Administered 2017-03-09 (×4): via INTRAVENOUS

## 2017-03-09 MED ORDER — SODIUM CHLORIDE 0.9 % IV
INTRAVENOUS | Status: DC | PRN
Start: 2017-03-09 — End: 2017-03-09
  Administered 2017-03-09: 15:00:00 via INTRAVENOUS

## 2017-03-09 MED ORDER — PROPOFOL 10 MG/ML IV EMUL
10 mg/mL | INTRAVENOUS | Status: DC | PRN
Start: 2017-03-09 — End: 2017-03-09
  Administered 2017-03-09 (×4): via INTRAVENOUS

## 2017-03-09 MED ORDER — LIDOCAINE (PF) 20 MG/ML (2 %) IJ SOLN
20 mg/mL (2 %) | INTRAMUSCULAR | Status: DC | PRN
Start: 2017-03-09 — End: 2017-03-09
  Administered 2017-03-09: 15:00:00 via INTRAVENOUS

## 2017-03-09 MED FILL — PROPOFOL 10 MG/ML IV EMUL: 10 mg/mL | INTRAVENOUS | Qty: 304.11

## 2017-03-09 MED FILL — SODIUM CHLORIDE 0.9 % IV: INTRAVENOUS | Qty: 600

## 2017-03-09 MED FILL — PROPOFOL 10 MG/ML IV EMUL: 10 mg/mL | INTRAVENOUS | Qty: 80

## 2017-03-09 MED FILL — SODIUM CHLORIDE 0.9 % IV: INTRAVENOUS | Qty: 1000

## 2017-03-09 MED FILL — LIDOCAINE 4 % TOPICAL SOLN: 4 % (0 mg/mL) | Qty: 3

## 2017-03-09 MED FILL — XYLOCAINE-MPF 20 MG/ML (2 %) INJECTION SOLUTION: 20 mg/mL (2 %) | INTRAMUSCULAR | Qty: 20

## 2017-03-09 NOTE — Anesthesia Post-Procedure Evaluation (Signed)
Post-Anesthesia Evaluation and Assessment    Patient: Brad Martinez. MRN: [id number]  SSN: [id number]    Date of Birth: Feb 10, 1941  Age: 76 y.o.  Sex: male       Cardiovascular Function/Vital Signs  Visit Vitals   ??? BP 164/76   ??? Pulse (!) 49   ??? Temp 36.6 ??C (97.9 ??F)   ??? Resp 19   ??? Ht 5\' 10"  (1.778 m)   ??? Wt 54.5 kg (120 lb 2.4 oz)   ??? SpO2 98%   ??? BMI 17.24 kg/m2       Patient is status post general, total IV anesthesia anesthesia for Procedure(s):  ESOPHAGOGASTRODUODENOSCOPY (EGD) with biopsies, CRE 8-78mm dilation,   COLONOSCOPY, SCREENING with polypectomy .    Nausea/Vomiting: None    Postoperative hydration reviewed and adequate.    Pain:  Pain Scale 1: Numeric (0 - 10) (03/09/17 1223)  Pain Intensity 1: 0 (03/09/17 1223)   Managed    Neurological Status:   Neuro (WDL): Within Defined Limits (03/09/17 1223)   At baseline    Mental Status and Level of Consciousness: Arousable    Pulmonary Status:   O2 Device: Room air (03/09/17 1223)   Adequate oxygenation and airway patent    Complications related to anesthesia: None    Post-anesthesia assessment completed. No concerns    Signed By: Larry Sierras, MD     March 09, 2017

## 2017-03-09 NOTE — Anesthesia Pre-Procedure Evaluation (Addendum)
Anesthetic History   No history of anesthetic complications            Review of Systems / Medical History  Patient summary reviewed, nursing notes reviewed and pertinent labs reviewed    Pulmonary          Smoker         Neuro/Psych         Psychiatric history     Cardiovascular    Hypertension                   GI/Hepatic/Renal               Comments: Dysphagia Endo/Other        Cancer     Other Findings   Comments: Bladder CA         Physical Exam    Airway  Mallampati: II  TM Distance: > 6 cm  Neck ROM: normal range of motion   Mouth opening: Normal     Cardiovascular    Rhythm: regular  Rate: normal         Dental    Dentition: Full lower dentures and Full upper dentures     Pulmonary    Rhonchi:bibasilar  Decreased breath sounds: bibasilar           Abdominal  GI exam deferred       Other Findings            Anesthetic Plan    ASA: 3  Anesthesia type: general and total IV anesthesia          Induction: Intravenous  Anesthetic plan and risks discussed with: Patient

## 2017-03-09 NOTE — H&P (Signed)
Date of Surgery Update:  Brad Martinez. was seen and examined.  History and physical has been reviewed. The patient has been examined. There have been no significant clinical changes since the completion of the originally dated History and Physical.    Signed By: Driscilla Grammes, MD     March 09, 2017 9:42 AM

## 2017-03-29 DIAGNOSIS — Z23 Encounter for immunization: Secondary | ICD-10-CM | POA: Diagnosis not present

## 2017-04-19 DIAGNOSIS — I10 Essential (primary) hypertension: Secondary | ICD-10-CM | POA: Diagnosis not present

## 2017-04-19 DIAGNOSIS — F418 Other specified anxiety disorders: Secondary | ICD-10-CM | POA: Diagnosis not present

## 2017-04-19 DIAGNOSIS — R51 Headache: Secondary | ICD-10-CM | POA: Diagnosis not present

## 2017-05-12 DIAGNOSIS — Z125 Encounter for screening for malignant neoplasm of prostate: Secondary | ICD-10-CM | POA: Diagnosis not present

## 2017-05-12 DIAGNOSIS — Z0000 Encounter for general adult medical examination without abnormal findings: Secondary | ICD-10-CM | POA: Diagnosis not present

## 2017-05-12 DIAGNOSIS — I10 Essential (primary) hypertension: Secondary | ICD-10-CM | POA: Diagnosis not present

## 2017-05-19 DIAGNOSIS — D649 Anemia, unspecified: Secondary | ICD-10-CM | POA: Diagnosis not present

## 2017-05-19 DIAGNOSIS — E785 Hyperlipidemia, unspecified: Secondary | ICD-10-CM | POA: Diagnosis not present

## 2017-05-19 DIAGNOSIS — N189 Chronic kidney disease, unspecified: Secondary | ICD-10-CM | POA: Diagnosis not present

## 2017-05-19 DIAGNOSIS — R001 Bradycardia, unspecified: Secondary | ICD-10-CM | POA: Diagnosis not present

## 2017-06-09 DIAGNOSIS — I10 Essential (primary) hypertension: Secondary | ICD-10-CM | POA: Diagnosis not present

## 2017-06-09 DIAGNOSIS — N189 Chronic kidney disease, unspecified: Secondary | ICD-10-CM | POA: Diagnosis not present

## 2017-06-09 DIAGNOSIS — D649 Anemia, unspecified: Secondary | ICD-10-CM | POA: Diagnosis not present

## 2017-08-18 DEATH — deceased

## 2018-02-02 ENCOUNTER — Encounter: Attending: Urology | Primary: Family Medicine
